# Patient Record
Sex: Female | Born: 1975 | Hispanic: Yes | Marital: Single | Smoking: Never smoker
Health system: Southern US, Community
[De-identification: ages and names within clinical notes are randomized; demographics above are authoritative.]

## PROBLEM LIST (undated history)

## (undated) DIAGNOSIS — Z789 Other specified health status: Secondary | ICD-10-CM

## (undated) HISTORY — PX: NO PAST SURGERIES: SHX2092

---

## 2005-07-30 ENCOUNTER — Emergency Department: Payer: Self-pay | Admitting: Emergency Medicine

## 2007-04-18 ENCOUNTER — Emergency Department: Payer: Self-pay | Admitting: Emergency Medicine

## 2016-03-13 ENCOUNTER — Emergency Department
Admission: EM | Admit: 2016-03-13 | Discharge: 2016-03-13 | Disposition: A | Discharge status: Home / Self Care | Payer: BLUE CROSS/BLUE SHIELD | Attending: Emergency Medicine | Admitting: Emergency Medicine

## 2016-03-13 ENCOUNTER — Emergency Department: Payer: BLUE CROSS/BLUE SHIELD

## 2016-03-13 ENCOUNTER — Encounter: Payer: Self-pay | Admitting: *Deleted

## 2016-03-13 DIAGNOSIS — N939 Abnormal uterine and vaginal bleeding, unspecified: Secondary | ICD-10-CM | POA: Diagnosis present

## 2016-03-13 DIAGNOSIS — Z3A12 12 weeks gestation of pregnancy: Secondary | ICD-10-CM | POA: Insufficient documentation

## 2016-03-13 DIAGNOSIS — O418X10 Other specified disorders of amniotic fluid and membranes, first trimester, not applicable or unspecified: Secondary | ICD-10-CM

## 2016-03-13 DIAGNOSIS — O200 Threatened abortion: Secondary | ICD-10-CM | POA: Diagnosis not present

## 2016-03-13 DIAGNOSIS — O468X1 Other antepartum hemorrhage, first trimester: Secondary | ICD-10-CM

## 2016-03-13 LAB — URINALYSIS COMPLETE WITH MICROSCOPIC (ARMC ONLY)
BILIRUBIN URINE: NEGATIVE
Bacteria, UA: NONE SEEN
Glucose, UA: NEGATIVE mg/dL
KETONES UR: NEGATIVE mg/dL
LEUKOCYTES UA: NEGATIVE
NITRITE: NEGATIVE
PH: 7 (ref 5.0–8.0)
PROTEIN: NEGATIVE mg/dL
Specific Gravity, Urine: 1.005 (ref 1.005–1.030)

## 2016-03-13 LAB — CBC
HEMATOCRIT: 38.5 % (ref 35.0–47.0)
Hemoglobin: 13.3 g/dL (ref 12.0–16.0)
MCH: 31.3 pg (ref 26.0–34.0)
MCHC: 34.6 g/dL (ref 32.0–36.0)
MCV: 90.5 fL (ref 80.0–100.0)
Platelets: 300 10*3/uL (ref 150–440)
RBC: 4.25 MIL/uL (ref 3.80–5.20)
RDW: 12.5 % (ref 11.5–14.5)
WBC: 10.4 10*3/uL (ref 3.6–11.0)

## 2016-03-13 LAB — HCG, QUANTITATIVE, PREGNANCY: HCG, BETA CHAIN, QUANT, S: 236500 m[IU]/mL — AB (ref <5)

## 2016-03-13 LAB — BASIC METABOLIC PANEL
ANION GAP: 8 (ref 5–15)
BUN: 6 mg/dL (ref 6–20)
CALCIUM: 9.4 mg/dL (ref 8.9–10.3)
CO2: 23 mmol/L (ref 22–32)
Chloride: 103 mmol/L (ref 101–111)
Creatinine, Ser: 0.5 mg/dL (ref 0.44–1.00)
GLUCOSE: 99 mg/dL (ref 65–99)
POTASSIUM: 3.8 mmol/L (ref 3.5–5.1)
Sodium: 134 mmol/L — ABNORMAL LOW (ref 135–145)

## 2016-03-13 LAB — POCT PREGNANCY, URINE: Preg Test, Ur: POSITIVE — AB

## 2016-03-13 NOTE — ED Provider Notes (Signed)
[facility] Emergency Department Provider Note  ____________________________________________  Time seen: Approximately 8:15 PM  I have reviewed the triage vital signs and the nursing notes.   HISTORY  Chief Complaint Vaginal Bleeding  The patient and/or family speak(s) Spanish.  They understand they have the right to the use of a hospital interpreter, however at this time they prefer to speak directly with me in Spanish.  They know that they can ask for an interpreter at any time.   HPI Alexandria Snyder is a 40 y.o. female with no significant past medical history who is pregnant at approximately [redacted] weeks gestation by dates.  She has not had any prenatal care yet although it is planned.  The patient reports that she had acute onset of a small amount of dark vaginal bleeding with small clots.  This started about 3 hours ago.  It is not been enough volume that she has needed to use a pad she has noticed that both when urinating and when not urinating.  She has had some lower abdominal cramps throughout her pregnancy as well as some lower back pain and this seems a little bit worse today but is still mild.  Nothing in particular makes her symptoms better or worse.  She denies fever/chills, chest pain, shortness of breath, nausea, vomiting, diarrhea, dysuria.  She is G1.   No past medical history on file.  There are no active problems to display for this patient.   No past surgical history on file.  No current outpatient prescriptions on file.  Allergies Review of patient's allergies indicates no known allergies.  No family history on file.  Social History Social History  Substance Use Topics  . Smoking status: Never Smoker   . Smokeless tobacco: Not on file  . Alcohol Use: No    Review of Systems Constitutional: No fever/chills Eyes: No visual changes. ENT: No sore throat. Cardiovascular: Denies chest pain. Respiratory: Denies shortness of  breath. Gastrointestinal: No abdominal pain.  No nausea, no vomiting.  No diarrhea.  No constipation. Genitourinary: Small amount of vaginal bleeding approximately [redacted] weeks gestation Musculoskeletal: Negative for back pain. Skin: Negative for rash. Neurological: Negative for headaches, focal weakness or numbness.  10-point ROS otherwise negative.  ____________________________________________   PHYSICAL EXAM:  VITAL SIGNS: ED Triage Vitals  Enc Vitals Group     BP 03/13/16 1750 129/74 mmHg     Pulse Rate 03/13/16 1750 74     Resp 03/13/16 1750 16     Temp 03/13/16 1750 98.8 F (37.1 C)     Temp Source 03/13/16 1750 Oral     SpO2 03/13/16 1750 98 %     Weight 03/13/16 1750 155 lb (70.308 kg)     Height 03/13/16 1750  (1.575 m)     Head Cir --      Peak Flow --      Pain Score 03/13/16 1756 5     Pain Loc --      Pain Edu? --      Excl. in GC? --     Constitutional: Alert and oriented. Well appearing and in no acute distress. Eyes: Conjunctivae are normal. PERRL. EOMI. Head: Atraumatic. Nose: No congestion/rhinnorhea. Mouth/Throat: Mucous membranes are moist.  Oropharynx non-erythematous. Neck: No stridor.  No meningeal signs.   Cardiovascular: Normal rate, regular rhythm. Good peripheral circulation. Grossly normal heart sounds.   Respiratory: Normal respiratory effort.  No retractions. Lungs CTAB. Gastrointestinal: Soft and nontender. No distention.  Genitourinary: Deferred Musculoskeletal: No lower extremity tenderness nor edema. No gross deformities of extremities. Neurologic:  Normal speech and language. No gross focal neurologic deficits are appreciated.  Skin:  Skin is warm, dry and intact. No rash noted. Psychiatric: Mood and affect are normal. Speech and behavior are normal.  ____________________________________________   LABS (all labs ordered are listed, but only abnormal results are displayed)  Labs Reviewed  HCG, QUANTITATIVE, PREGNANCY -  Abnormal; Notable for the following:    hCG, Beta Chain, [place] Longest [id number] (*)    All other components within normal limits  BASIC METABOLIC PANEL - Abnormal; Notable for the following:    Sodium 134 (*)    All other components within normal limits  URINALYSIS COMPLETEWITH MICROSCOPIC ([facility] ONLY) - Abnormal; Notable for the following:    Color, Urine STRAW (*)    APPearance CLEAR (*)    Hgb urine dipstick 3+ (*)    Squamous Epithelial / LPF 0-5 (*)    All other components within normal limits  POCT PREGNANCY, URINE - Abnormal; Notable for the following:    Preg Test, Ur POSITIVE (*)    All other components within normal limits  CBC  POC URINE PREG, ED  ABO/RH   ____________________________________________  EKG  None ____________________________________________  RADIOLOGY   US Ob Comp Less 14 Wks  03/13/2016  CLINICAL DATA:  40 year old pregnant female presenting with 1 hour history of vaginal bleeding. EXAM: OBSTETRIC <14 WK ULTRASOUND TECHNIQUE: Transabdominal ultrasound was performed for evaluation of the gestation as well as the maternal uterus and adnexal regions. COMPARISON:  No priors. FINDINGS: Intrauterine gestational sac: Present. Yolk sac:  Present. Embryo:  Present. Cardiac Activity: Present. Heart Rate: 169 bpm CRL:   3.1  mm   10 w 0 d                  [place] EDC: 10/09/2016. Subchorionic hemorrhage:  Small. Maternal uterus/adnexae: Probable degenerating corpus luteum cyst in the right ovary. Left ovary could not be visualized secondary to overlying bowel gas. No significant volume of free fluid in the cul-de-sac. IMPRESSION: 1. Single viable IUP with estimated gestational age of [redacted] weeks and 0 days, and normal fetal heart rate of 169 beats per minute. 2. Small subchorionic hemorrhage. Electronically Signed   By: Trudie Reed M.D.   On: 03/13/2016 21:06    ____________________________________________   PROCEDURES  Procedure(s) performed: None  Critical Care  performed: No ____________________________________________   INITIAL IMPRESSION / ASSESSMENT AND PLAN / ED COURSE  Pertinent labs & imaging results that were available during my care of the patient were reviewed by me and considered in my medical decision making (see chart for details).  O+, no need for RhoGAM.  U/S pending.   (Note that documentation was delayed due to multiple ED patients requiring immediate care.)   Ultrasound consistent with a small subchorionic hemorrhage and a well-appearing single IUP at 10 weeks.  I updated the patient and her companion with this information.  She is comfortable following up as an outpatient.  I gave my usual and customary return precautions  ____________________________________________  FINAL CLINICAL IMPRESSION(S) / ED DIAGNOSES  Final diagnoses:  Threatened miscarriage in early pregnancy  Subchorionic hemorrhage, first trimester      NEW MEDICATIONS STARTED DURING THIS VISIT:  There are no discharge medications for this patient.     Note:  This document was prepared using Dragon voice recognition software and may include unintentional dictation errors.   Loleta Rose,  MD 03/13/16 2338

## 2016-03-13 NOTE — ED Notes (Addendum)
Dr. York CeriseForbach at bedside.   Pt presents with vaginal bleeding that began 3 hours ago, some cramping and back pain. Pt is [redacted] weeks pregnant. Family at bedside.

## 2016-03-13 NOTE — ED Notes (Signed)
Pt reports she is pregnant approx 12 weeks.  Today pt started having vag bleeding.  Pt has abd pain and back pain  No dysuria.  Pt alert.

## 2016-03-13 NOTE — ED Notes (Signed)
Called lab about ABO/Rh. Lab is able to run test off of short lavender sent down earlier. They stated they would run test.

## 2016-03-13 NOTE — Discharge Instructions (Signed)
Hematoma subcorinico (Subchorionic Hematoma) Un hematoma subcorinico es una acumulacin de sangre entre la pared externa de la placenta y la pared interna del la matriz (tero). La placenta es el rgano que conecta el feto a la pared del tero. La placenta realiza la funcin de alimentacin, respiracin (oxgeno al feto) y el trabajo de eliminacin de desechos (excrecin) del feto.  Un hematoma subcorinico es la anormalidad ms frecuente encontrada en una ecografa durante el primer trimestre o principios del segundo trimestre del embarazo. Si ha habido poca o ninguna hemorragia vaginal, generalmente los pequeos hematomas se reducen por su propia cuenta y no afectan al beb ni al Vanetta Muldersembarazo. La sangre es absorbida gradualmente durante una o Scotiados semanas. Cuando la hemorragia comienza ms tarde en el embarazo o el hematoma es ms grande o se produce en una paciente de edad avanzada, el resultado puede no ser tan bueno. Los grandes hematomas pueden agrandarse an ms y Lesothoaumenta las posibilidades de aborto espontneo. El hematoma subcorinico tambin aumenta el riesgo de desprendimiento precoz de la placenta del tero, muerte fetal y Sport and exercise psychologistparto prematuro. INSTRUCCIONES PARA EL CUIDADO EN EL HOGAR   Repose en cama si el mdico se lo recomienda. Aunque el reposo en cama no evitar la hemorragia o un aborto espontneo, su mdico puede recomendarlo.  Evite levantar objetos pesados (ms de 10 libras [4,5 kg]), hacer ejercicio, tener relaciones sexuales o realizar duchas vaginales segn se lo indique el profesional.  Lleve un registro de la cantidad y Hydrographic surveyorgrado de remojo (saturacin) de las toallas higinicas que Landscape architectutiliza cada da. Anote esta informacin.  No use tampones.  Cumpla con todas las visitas de control, segn le indique su mdico. El profesional podr pedirle que se realice anlisis de seguimiento, pruebas de Peninsulaultrasonido o Lititzambas. SOLICITE ATENCIN MDICA DE INMEDIATO SI:   Siente calambres intensos en el  estmago, en la espalda, en el abdomen o en la pelvis.  Tiene fiebre.  Elimina cogulos o tejidos grandes. Guarde los tejidos para que su mdico los vea.  Si la hemorragia aumenta o siente mareos, debilidad o tiene episodios de Russellvilledesmayos.   Esta informacin no tiene Theme park managercomo fin reemplazar el consejo del mdico. Asegrese de hacerle al mdico cualquier pregunta que tenga.   Document Released: 02/27/2009 Document Revised: 09/01/2013 Elsevier Interactive Patient Education 2016 [employer]  Amenaza de aborto (Threatened Miscarriage) La amenaza de aborto se produce cuando hay hemorragia vaginal durante las primeras 20semanas de Hudsonembarazo, pero el embarazo no se interrumpe. Si durante este perodo usted tiene hemorragia vaginal, el mdico le har pruebas para asegurarse de que el embarazo contine. Si las pruebas muestran que usted contina embarazada y que el "beb" en desarrollo (feto) dentro del tero sigue creciendo, se considera que tuvo una South Londonderryamenaza de aborto. La amenaza de aborto no implica que el embarazo vaya a Midwifeterminar, pero s aumenta el riesgo de perder el embarazo (aborto completo). CAUSAS  Por lo general, no se conoce la causa de la amenaza de aborto. Si el resultado final es el aborto completo, la causa ms frecuente es la cantidad anormal de cromosomas del feto. Los cromosomas son las estructuras internas de las clulas que contienen todo Animatorel material gentico. Environmental managerAlgunas de las causas de hemorragia vaginal que no ocasionan un aborto incluyen:  Las Clinical research associaterelaciones sexuales.  Las infecciones.  Los cambios hormonales normales durante el Ryderembarazo.  La hemorragia que se produce cuando el vulo se implanta en el tero. FACTORES DE RIESGO Los factores de riesgo de hemorragia al principio del  embarazo incluyen:  Obesidad.  Fumar.  El consumo de cantidades excesivas de alcohol o cafena.  El consumo de drogas. SIGNOS Y SNTOMAS  Hemorragia vaginal leve.  Dolor o clicos abdominales  leves. DIAGNSTICO  Si tiene hemorragia con o sin dolor abdominal antes de las 20semanas de Hiram, el mdico le har pruebas para determinar si el embarazo contina. Una prueba importante incluye el uso de ondas sonoras y de una computadora (ecografa) para crear imgenes del interior del tero. Otras pruebas incluyen el examen interno de la vagina y el tero (examen plvico), y el control de la frecuencia cardaca del feto.  Es posible que le diagnostiquen una amenaza de aborto en los siguientes casos:  La ecografa muestra que el embarazo contina.  La frecuencia cardaca del feto es alta.  El examen plvico muestra que la apertura entre el tero y la vagina (cuello del tero) est cerrada.  Su frecuencia cardaca y su presin arterial estn estables.  Los [employer] Corporation de sangre confirman que el embarazo contina. TRATAMIENTO  No se ha demostrado que ningn tratamiento evite que una amenaza de aborto se [place] en un aborto completo. Sin embargo, los cuidados [employer] hogar son importantes.  INSTRUCCIONES PARA EL CUIDADO EN EL HOGAR   Asegrese de asistir a todas las citas de cuidados prenatales. Esto es [employer].  Descanse lo suficiente.  No tenga relaciones sexuales ni use tampones si tiene hemorragia vaginal.  No se haga duchas vaginales.  No fume ni consuma drogas.  No beba alcohol.  Evite la cafena. SOLICITE ATENCIN MDICA SI:  Tiene una ligera hemorragia o manchado vaginal durante el embarazo.  Tiene dolor o clicos en el abdomen.  Tiene fiebre. SOLICITE ATENCIN MDICA DE INMEDIATO SI:  Tiene una hemorragia vaginal abundante.  Elimina cogulos de sangre por la vagina.  Siente dolor en la parte baja de la espalda o clicos abdominales intensos.  Tiene fiebre, escalofros y dolor abdominal intenso. ASEGRESE DE QUE:  Comprende estas instrucciones.  Controlar su afeccin.  Recibir ayuda de inmediato si no mejora o si empeora.   Esta  informacin no tiene Theme park manager el consejo del mdico. Asegrese de hacerle al mdico cualquier pregunta que tenga.   Document Released: 08/21/2005 Document Revised: 11/16/2013 Elsevier Interactive Patient Education 2016 [employer].  Hemorragia vaginal durante Firefighter (primer trimestre) (Vaginal Bleeding During Pregnancy, First Trimester) Durante los primeros meses del embarazo es relativamente frecuente que se presente una pequea hemorragia (manchas). Esta situacin generalmente mejora por s misma. Estas hemorragias o manchas tienen diversas causas al inicio del embarazo. Algunas manchas pueden estar relacionadas al [employer] y otras no. En la [employer], la hemorragia es normal y no es un problema. Sin embargo, la hemorragia tambin puede ser un signo de algo grave. Debe informar a su mdico de inmediato si tiene alguna hemorragia vaginal. Algunas causas posibles de hemorragia vaginal durante el primer trimestre incluyen:  Infeccin o inflamacin del cuello del tero.  Crecimientos anormales (plipos) en el cuello del tero.  Aborto espontneo o amenaza de aborto espontneo.  Tejido del Psychiatrist se ha desarrollado fuera del tero y en una trompa de falopio (embarazo ectpico).  Se han desarrollado pequeos quistes en el tero en lugar de tejido de embarazo (embarazo molar). INSTRUCCIONES PARA EL CUIDADO EN EL HOGAR  Controle su afeccin para ver si hay cambios. Las siguientes indicaciones ayudarn a Psychologist, educational [employer] pueda sentir:  Siga las indicaciones del mdico para restringir su actividad. Si  el mdico le indica descanso en la cama, debe quedarse en la cama y levantarse solo para ir al bao. No obstante, el mdico puede permitirle que continu con tareas livianas.  Si es necesario, organcese para que alguien le ayude con las actividades y responsabilidades cotidianas mientras est en cama.  Lleve un registro de la cantidad y la saturacin de  las toallas higinicas que Landscape architect. Anote este dato.  No use tampones.No se haga duchas vaginales.  No tenga relaciones sexuales u orgasmos hasta que el mdico la autorice.  Si elimina tejido por la vagina, gurdelo para mostrrselo al [employer].  [place] solo medicamentos de venta libre o recetados, segn las indicaciones del mdico.  No tome aspirina, ya que puede causar hemorragias.  Cumpla con todas las visitas de control, segn le indique su mdico. SOLICITE ATENCIN MDICA SI:  Tiene un sangrado vaginal en cualquier momento del embarazo.  Tiene calambres o dolores de [place].  Tiene fiebre que los medicamentos no Sports coach. SOLICITE ATENCIN MDICA DE INMEDIATO SI:   Siente calambres intensos en la espalda o en el vientre (abdomen).  Elimina cogulos grandes o tejido por la vagina.  La hemorragia aumenta.  Si se siente mareada, dbil o se desmaya.  Tiene escalofros.  Tiene una prdida importante o sale lquido a borbotones por la vagina.  Se desmaya mientras defeca. ASEGRESE DE QUE:  Comprende estas instrucciones.  Controlar su afeccin.  Recibir ayuda de inmediato si no mejora o si empeora.   Esta informacin no tiene Theme park manager el consejo del mdico. Asegrese de hacerle al mdico cualquier pregunta que tenga.   Document Released: 08/21/2005 Document Revised: 11/16/2013 Elsevier Interactive Patient Education [employer]

## 2016-03-14 LAB — ABO/RH: ABO/RH(D): O POS

## 2016-05-01 ENCOUNTER — Other Ambulatory Visit: Payer: Self-pay | Admitting: Family Medicine

## 2016-05-01 DIAGNOSIS — Z3492 Encounter for supervision of normal pregnancy, unspecified, second trimester: Secondary | ICD-10-CM

## 2016-05-01 LAB — OB RESULTS CONSOLE HEPATITIS B SURFACE ANTIGEN: HEP B S AG: NEGATIVE

## 2016-05-01 LAB — OB RESULTS CONSOLE HIV ANTIBODY (ROUTINE TESTING): HIV: NONREACTIVE

## 2016-05-01 LAB — OB RESULTS CONSOLE GC/CHLAMYDIA
Chlamydia: POSITIVE
Gonorrhea: NEGATIVE

## 2016-05-01 LAB — OB RESULTS CONSOLE RUBELLA ANTIBODY, IGM: RUBELLA: IMMUNE

## 2016-05-01 LAB — OB RESULTS CONSOLE RPR: RPR: NONREACTIVE

## 2016-05-01 LAB — OB RESULTS CONSOLE VARICELLA ZOSTER ANTIBODY, IGG: VARICELLA IGG: IMMUNE

## 2016-05-15 ENCOUNTER — Ambulatory Visit
Admission: RE | Admit: 2016-05-15 | Discharge: 2016-05-15 | Disposition: A | Discharge status: Home / Self Care | Payer: BLUE CROSS/BLUE SHIELD | Source: Ambulatory Visit | Attending: Family Medicine | Admitting: Family Medicine

## 2016-05-15 DIAGNOSIS — Z3492 Encounter for supervision of normal pregnancy, unspecified, second trimester: Secondary | ICD-10-CM | POA: Diagnosis not present

## 2016-05-15 DIAGNOSIS — Z3A19 19 weeks gestation of pregnancy: Secondary | ICD-10-CM | POA: Diagnosis not present

## 2016-08-05 ENCOUNTER — Ambulatory Visit
Admission: RE | Admit: 2016-08-05 | Discharge: 2016-08-05 | Disposition: A | Discharge status: Home / Self Care | Payer: Self-pay | Source: Ambulatory Visit | Attending: Family Medicine | Admitting: Family Medicine

## 2016-08-05 ENCOUNTER — Other Ambulatory Visit (HOSPITAL_COMMUNITY): Payer: Self-pay | Admitting: Family Medicine

## 2016-08-05 DIAGNOSIS — R7611 Nonspecific reaction to tuberculin skin test without active tuberculosis: Secondary | ICD-10-CM

## 2016-09-03 IMAGING — US US OB COMP LESS 14 WK
1 series · 14 of 28 positions shown · non-contrast
Comparison: No priors.

CLINICAL DATA: 40-year-old pregnant female presenting with 1 hour
history of vaginal bleeding.

EXAM:
OBSTETRIC <14 WK ULTRASOUND
TECHNIQUE: Transabdominal ultrasound was performed for evaluation of the
gestation as well as the maternal uterus and adnexal regions.

[Series 1: us ob comp less 14 wk · 0.23mm/px · 14 of 78 slices shown]
[im 3/78]
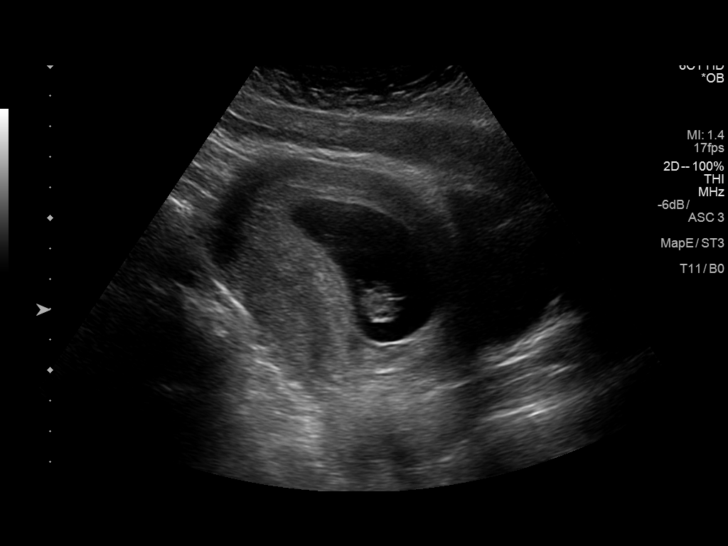
[im 9/78]
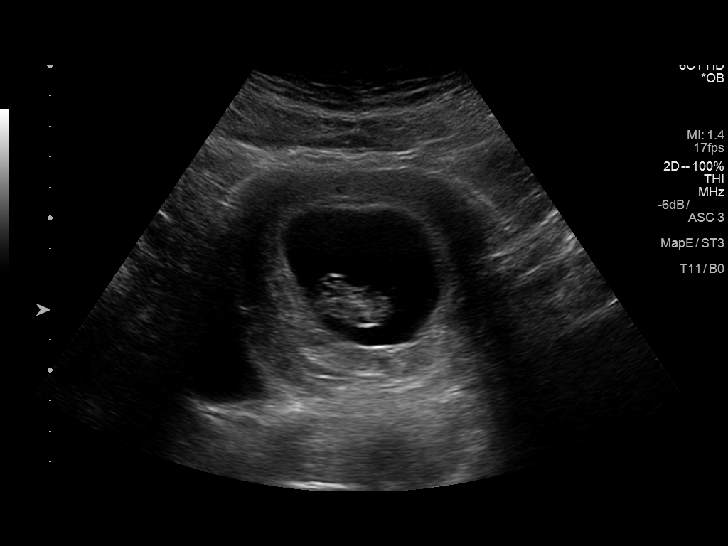
[im 15/78]
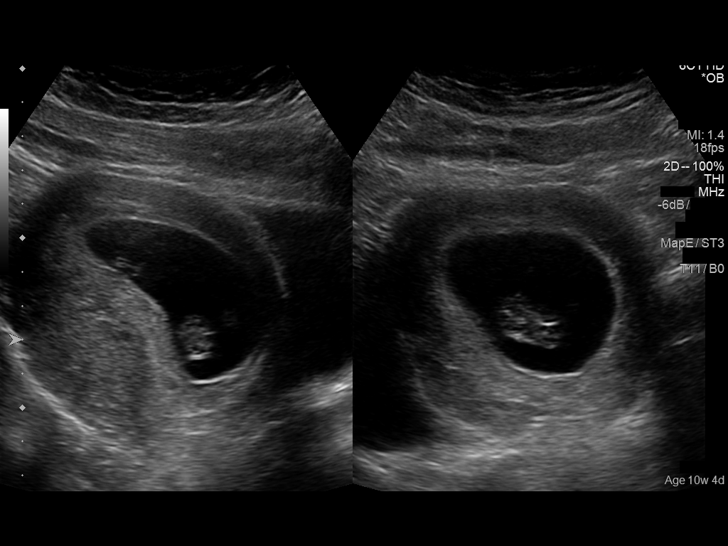
[im 20/78]
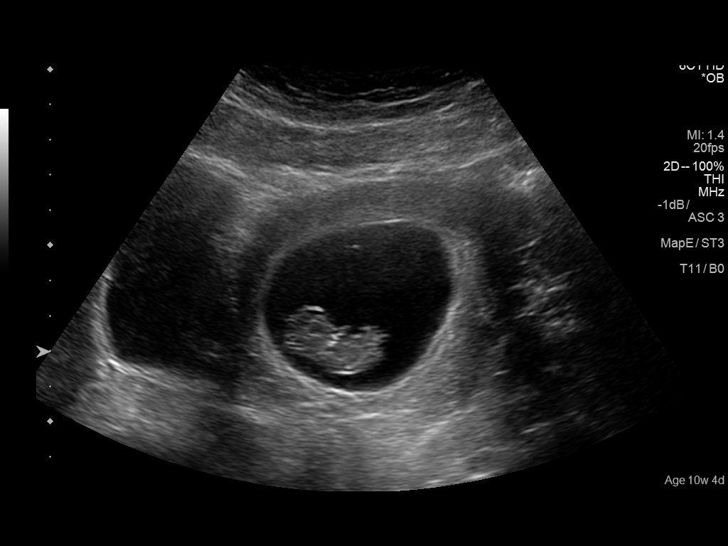
[im 26/78]
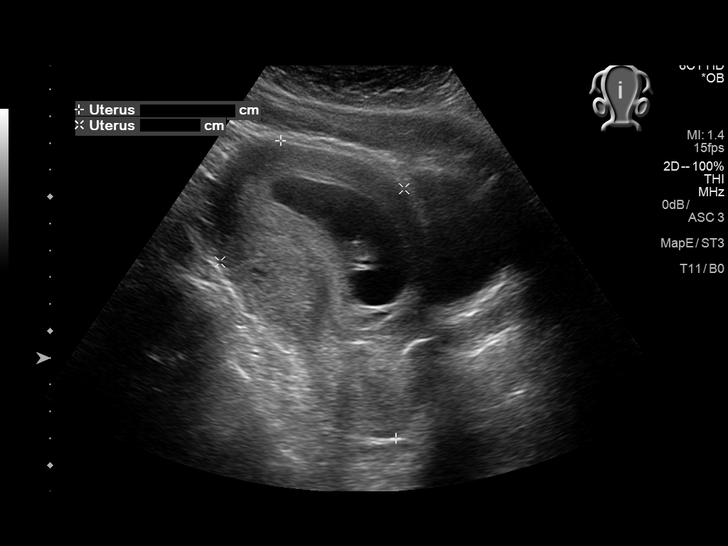
[im 32/78]
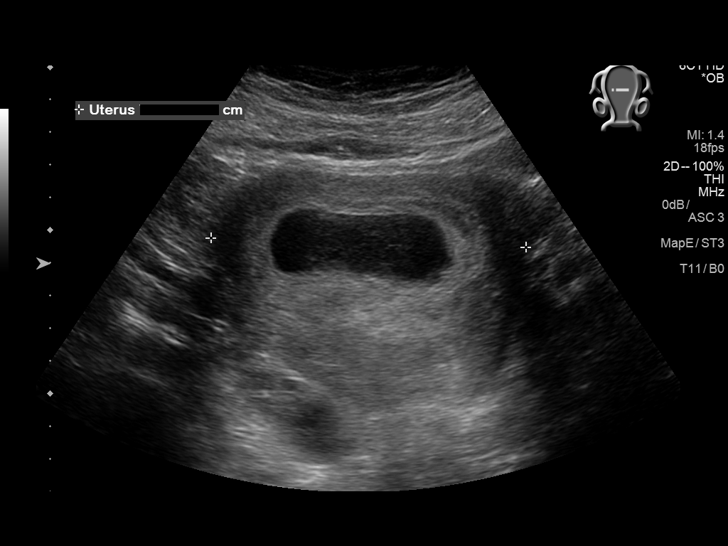
[im 38/78]
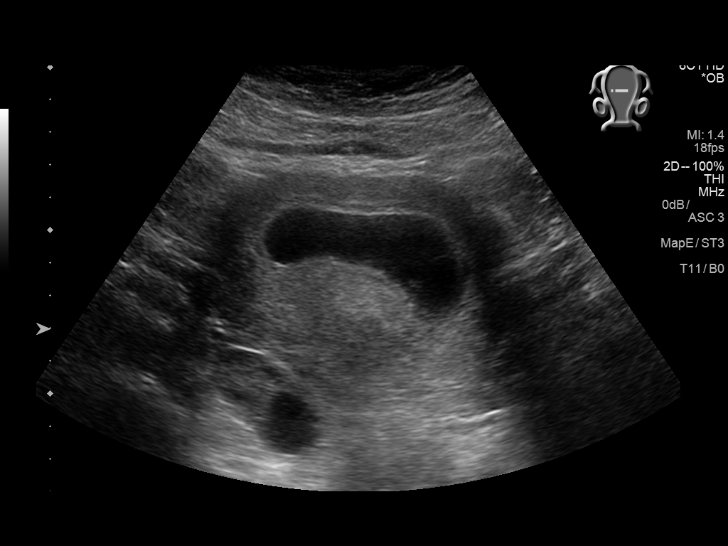
[im 43/78]
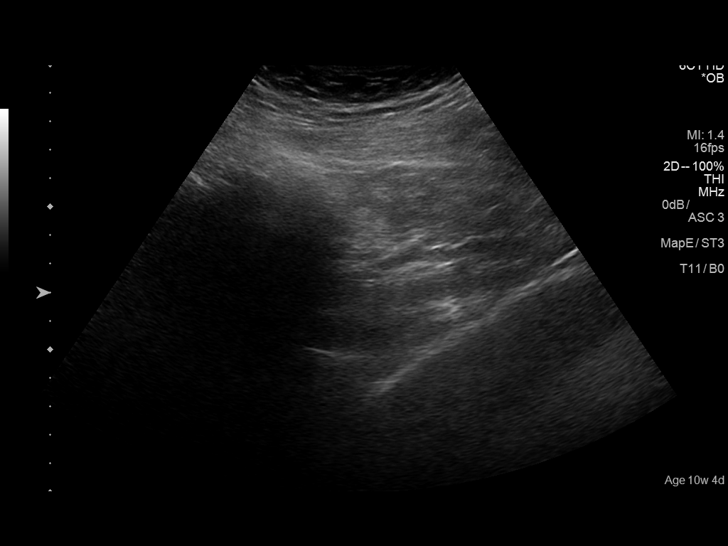
[im 49/78]
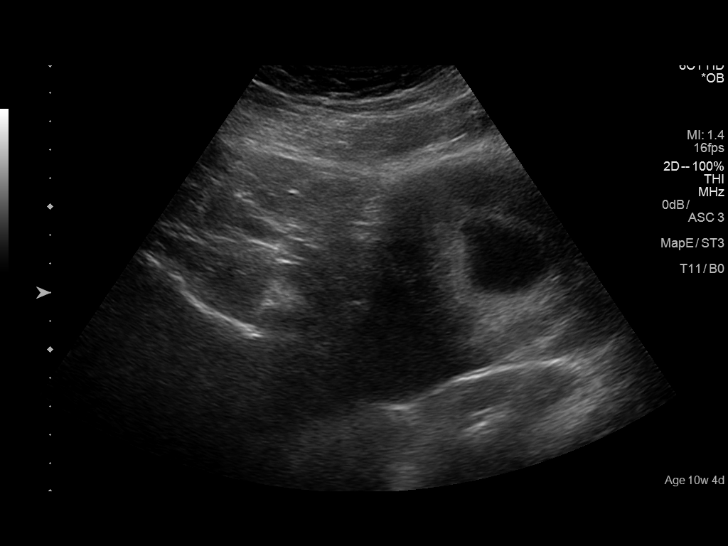
[im 55/78]
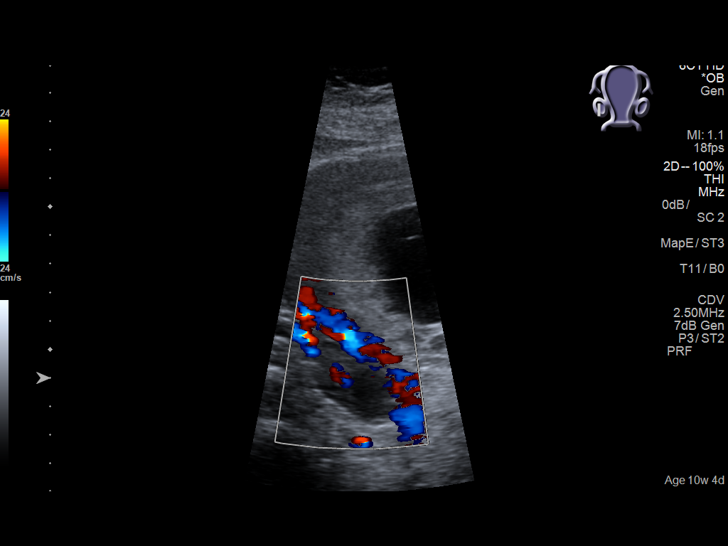
[im 60/78]
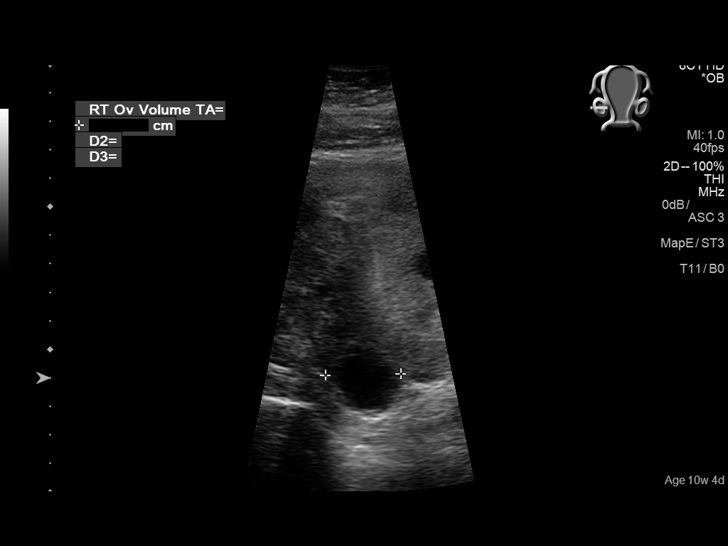
[im 66/78]
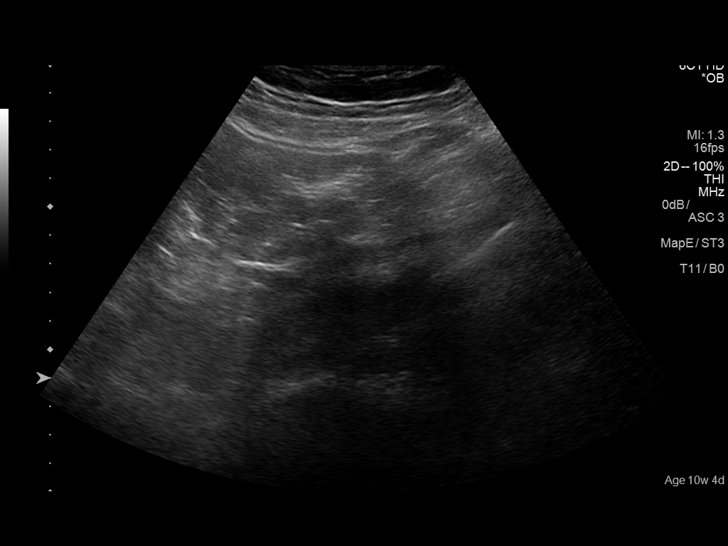
[im 72/78]
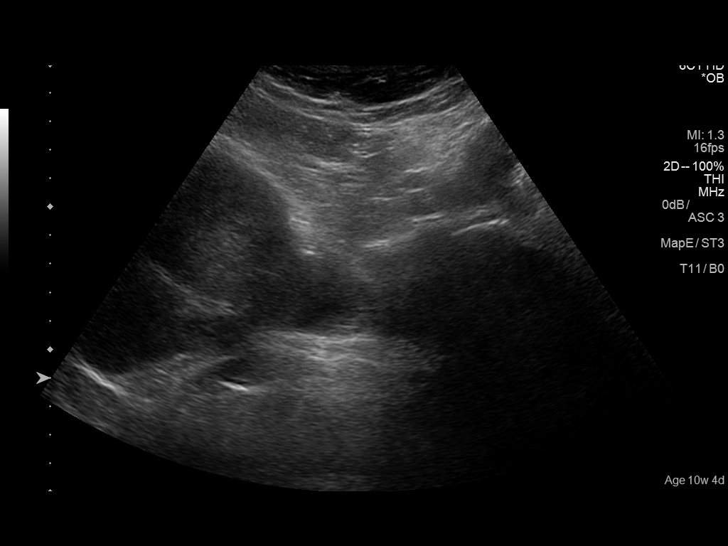
[im 78/78]
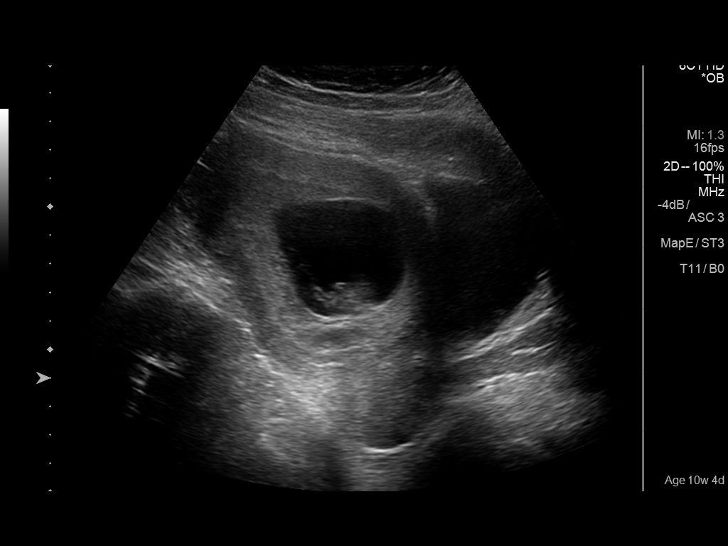

[14 of 28 positions shown; findings below may reference images not displayed]

FINDINGS: Intrauterine gestational sac: Present.

Yolk sac:  Present.

Embryo:  Present.

Cardiac Activity: Present.

Heart Rate: 169 bpm

CRL:   3.1  mm   10 w 0 d                  US EDC: 10/09/2016.

Subchorionic hemorrhage:  Small.

Maternal uterus/adnexae: Probable degenerating corpus luteum cyst in
the right ovary. Left ovary could not be visualized secondary to
overlying bowel gas. No significant volume of free fluid in the
cul-de-sac.
IMPRESSION: 1. Single viable IUP with estimated gestational age of 10 weeks and
0 days, and normal fetal heart rate of 169 beats per minute.
2. Small subchorionic hemorrhage.

## 2016-09-12 LAB — OB RESULTS CONSOLE GBS: GBS: NEGATIVE

## 2016-10-11 ENCOUNTER — Inpatient Hospital Stay: Payer: BLUE CROSS/BLUE SHIELD | Admitting: Anesthesiology

## 2016-10-11 ENCOUNTER — Inpatient Hospital Stay
Admission: EM | Admit: 2016-10-11 | Discharge: 2016-10-14 | DRG: 766 | Disposition: A | Discharge status: Home / Self Care | Payer: BLUE CROSS/BLUE SHIELD | Attending: Obstetrics and Gynecology | Admitting: Obstetrics and Gynecology

## 2016-10-11 ENCOUNTER — Encounter
Admission: EM | Disposition: A | Discharge status: Home / Self Care | Payer: Self-pay | Source: Home / Self Care | Attending: Obstetrics and Gynecology

## 2016-10-11 DIAGNOSIS — O9902 Anemia complicating childbirth: Secondary | ICD-10-CM | POA: Diagnosis present

## 2016-10-11 DIAGNOSIS — Z9889 Other specified postprocedural states: Secondary | ICD-10-CM

## 2016-10-11 DIAGNOSIS — Z3A40 40 weeks gestation of pregnancy: Secondary | ICD-10-CM

## 2016-10-11 DIAGNOSIS — O4292 Full-term premature rupture of membranes, unspecified as to length of time between rupture and onset of labor: Secondary | ICD-10-CM | POA: Diagnosis present

## 2016-10-11 DIAGNOSIS — D649 Anemia, unspecified: Secondary | ICD-10-CM | POA: Diagnosis present

## 2016-10-11 DIAGNOSIS — Z3490 Encounter for supervision of normal pregnancy, unspecified, unspecified trimester: Secondary | ICD-10-CM

## 2016-10-11 HISTORY — DX: Other specified health status: Z78.9

## 2016-10-11 LAB — COMPREHENSIVE METABOLIC PANEL
ALT: 14 U/L (ref 14–54)
ANION GAP: 9 (ref 5–15)
AST: 25 U/L (ref 15–41)
Albumin: 2.7 g/dL — ABNORMAL LOW (ref 3.5–5.0)
Alkaline Phosphatase: 306 U/L — ABNORMAL HIGH (ref 38–126)
BUN: 11 mg/dL (ref 6–20)
CHLORIDE: 110 mmol/L (ref 101–111)
CO2: 17 mmol/L — AB (ref 22–32)
Calcium: 8.3 mg/dL — ABNORMAL LOW (ref 8.9–10.3)
Creatinine, Ser: 0.6 mg/dL (ref 0.44–1.00)
Glucose, Bld: 101 mg/dL — ABNORMAL HIGH (ref 65–99)
POTASSIUM: 3.8 mmol/L (ref 3.5–5.1)
SODIUM: 136 mmol/L (ref 135–145)
Total Bilirubin: 0.5 mg/dL (ref 0.3–1.2)
Total Protein: 6.7 g/dL (ref 6.5–8.1)

## 2016-10-11 LAB — PROTEIN / CREATININE RATIO, URINE
CREATININE, URINE: 96 mg/dL
PROTEIN CREATININE RATIO: 0.86 mg/mg{creat} — AB (ref 0.00–0.15)
TOTAL PROTEIN, URINE: 83 mg/dL

## 2016-10-11 LAB — CBC
HCT: 34.8 % — ABNORMAL LOW (ref 35.0–47.0)
Hemoglobin: 12 g/dL (ref 12.0–16.0)
MCH: 31.7 pg (ref 26.0–34.0)
MCHC: 34.4 g/dL (ref 32.0–36.0)
MCV: 92 fL (ref 80.0–100.0)
PLATELETS: 285 10*3/uL (ref 150–440)
RBC: 3.78 MIL/uL — AB (ref 3.80–5.20)
RDW: 13.6 % (ref 11.5–14.5)
WBC: 8.9 10*3/uL (ref 3.6–11.0)

## 2016-10-11 LAB — CHLAMYDIA/NGC RT PCR (ARMC ONLY)
CHLAMYDIA TR: NOT DETECTED
N GONORRHOEAE: NOT DETECTED

## 2016-10-11 LAB — TYPE AND SCREEN
ABO/RH(D): O POS
ANTIBODY SCREEN: NEGATIVE

## 2016-10-11 SURGERY — Surgical Case
Anesthesia: Spinal | Site: Abdomen | Wound class: Clean Contaminated

## 2016-10-11 MED ORDER — OXYTOCIN 40 UNITS IN LACTATED RINGERS INFUSION - SIMPLE MED
1.0000 m[IU]/min | INTRAVENOUS | Status: DC
  Administered 2016-10-11: 1 m[IU]/min via INTRAVENOUS

## 2016-10-11 MED ORDER — ONDANSETRON HCL 4 MG/2ML IJ SOLN: 4.0000 mg | Freq: Four times a day (QID) | INTRAMUSCULAR | Status: DC | PRN

## 2016-10-11 MED ORDER — SENNOSIDES-DOCUSATE SODIUM 8.6-50 MG PO TABS
2.0000 | ORAL_TABLET | ORAL | Status: DC
  Administered 2016-10-13 (×2): 2 via ORAL
  Filled 2016-10-11 (×2): qty 2

## 2016-10-11 MED ORDER — OXYTOCIN BOLUS FROM INFUSION: 500.0000 mL | Freq: Once | INTRAVENOUS | Status: DC

## 2016-10-11 MED ORDER — ACETAMINOPHEN 325 MG PO TABS: 650.0000 mg | ORAL_TABLET | ORAL | Status: DC | PRN

## 2016-10-11 MED ORDER — SIMETHICONE 80 MG PO CHEW
80.0000 mg | CHEWABLE_TABLET | ORAL | Status: DC
  Administered 2016-10-12 – 2016-10-13 (×3): 80 mg via ORAL
  Filled 2016-10-11 (×3): qty 1

## 2016-10-11 MED ORDER — LACTATED RINGERS IV SOLN
INTRAVENOUS | Status: DC
  Administered 2016-10-11 (×3): via INTRAVENOUS

## 2016-10-11 MED ORDER — PHENYLEPHRINE 40 MCG/ML (10ML) SYRINGE FOR IV PUSH (FOR BLOOD PRESSURE SUPPORT)
PREFILLED_SYRINGE | INTRAVENOUS | Status: DC | PRN
  Administered 2016-10-11 (×2): 100 ug via INTRAVENOUS

## 2016-10-11 MED ORDER — OXYCODONE HCL 5 MG PO TABS: 5.0000 mg | ORAL_TABLET | ORAL | Status: DC | PRN

## 2016-10-11 MED ORDER — DIPHENHYDRAMINE HCL 25 MG PO CAPS: 25.0000 mg | ORAL_CAPSULE | ORAL | Status: DC | PRN

## 2016-10-11 MED ORDER — KETOROLAC TROMETHAMINE 30 MG/ML IJ SOLN: 30.0000 mg | Freq: Four times a day (QID) | INTRAMUSCULAR | Status: AC

## 2016-10-11 MED ORDER — PHENYLEPHRINE HCL 10 MG/ML IJ SOLN
INTRAMUSCULAR | Status: DC | PRN
  Administered 2016-10-11: 100 ug via INTRAVENOUS

## 2016-10-11 MED ORDER — LIDOCAINE HCL (PF) 1 % IJ SOLN: 30.0000 mL | INTRAMUSCULAR | Status: DC | PRN

## 2016-10-11 MED ORDER — KETOROLAC TROMETHAMINE 30 MG/ML IJ SOLN
30.0000 mg | Freq: Four times a day (QID) | INTRAMUSCULAR | Status: AC
  Administered 2016-10-11 – 2016-10-12 (×4): 30 mg via INTRAVENOUS
  Filled 2016-10-11 (×4): qty 1

## 2016-10-11 MED ORDER — TETANUS-DIPHTH-ACELL PERTUSSIS 5-2.5-18.5 LF-MCG/0.5 IM SUSP: 0.5000 mL | Freq: Once | INTRAMUSCULAR | Status: DC

## 2016-10-11 MED ORDER — CEFAZOLIN SODIUM-DEXTROSE 2-4 GM/100ML-% IV SOLN
2.0000 g | Freq: Once | INTRAVENOUS | Status: AC
  Administered 2016-10-11: 2 g via INTRAVENOUS

## 2016-10-11 MED ORDER — SIMETHICONE 80 MG PO CHEW
80.0000 mg | CHEWABLE_TABLET | Freq: Three times a day (TID) | ORAL | Status: DC
  Administered 2016-10-12 – 2016-10-14 (×7): 80 mg via ORAL
  Filled 2016-10-11 (×7): qty 1

## 2016-10-11 MED ORDER — SIMETHICONE 80 MG PO CHEW: 80.0000 mg | CHEWABLE_TABLET | ORAL | Status: DC | PRN

## 2016-10-11 MED ORDER — MISOPROSTOL 200 MCG PO TABS
ORAL_TABLET | ORAL | Status: AC
  Filled 2016-10-11: qty 4

## 2016-10-11 MED ORDER — METHYLERGONOVINE MALEATE 0.2 MG/ML IJ SOLN
INTRAMUSCULAR | Status: DC | PRN
  Administered 2016-10-11: 0.2 mg via INTRAMUSCULAR

## 2016-10-11 MED ORDER — SOD CITRATE-CITRIC ACID 500-334 MG/5ML PO SOLN
ORAL | Status: AC
  Administered 2016-10-11: 30 mL via ORAL
  Filled 2016-10-11: qty 15

## 2016-10-11 MED ORDER — OXYCODONE HCL 5 MG PO TABS: 10.0000 mg | ORAL_TABLET | ORAL | Status: DC | PRN

## 2016-10-11 MED ORDER — WITCH HAZEL-GLYCERIN EX PADS: 1.0000 "application " | MEDICATED_PAD | CUTANEOUS | Status: DC | PRN

## 2016-10-11 MED ORDER — AZITHROMYCIN 500 MG IV SOLR
500.0000 mg | Freq: Once | INTRAVENOUS | Status: AC
  Administered 2016-10-11: 500 mg via INTRAVENOUS
  Filled 2016-10-11 (×2): qty 500

## 2016-10-11 MED ORDER — SOD CITRATE-CITRIC ACID 500-334 MG/5ML PO SOLN
30.0000 mL | ORAL | Status: DC | PRN
  Administered 2016-10-11: 30 mL via ORAL

## 2016-10-11 MED ORDER — DIBUCAINE 1 % RE OINT: 1.0000 "application " | TOPICAL_OINTMENT | RECTAL | Status: DC | PRN

## 2016-10-11 MED ORDER — OXYTOCIN 40 UNITS IN LACTATED RINGERS INFUSION - SIMPLE MED
2.5000 [IU]/h | INTRAVENOUS | Status: DC
  Administered 2016-10-11: 1 mL via INTRAVENOUS
  Filled 2016-10-11: qty 1000

## 2016-10-11 MED ORDER — LACTATED RINGERS IV SOLN: 500.0000 mL | INTRAVENOUS | Status: DC | PRN

## 2016-10-11 MED ORDER — BUPIVACAINE IN DEXTROSE 0.75-8.25 % IT SOLN
INTRATHECAL | Status: DC | PRN
  Administered 2016-10-11: 1.5 mL via INTRATHECAL

## 2016-10-11 MED ORDER — DIPHENHYDRAMINE HCL 50 MG/ML IJ SOLN: 12.5000 mg | INTRAMUSCULAR | Status: DC | PRN

## 2016-10-11 MED ORDER — MORPHINE SULFATE (PF) 0.5 MG/ML IJ SOLN
INTRAMUSCULAR | Status: DC | PRN
  Administered 2016-10-11: .1 mg via EPIDURAL

## 2016-10-11 MED ORDER — SCOPOLAMINE 1 MG/3DAYS TD PT72: 1.0000 | MEDICATED_PATCH | Freq: Once | TRANSDERMAL | Status: DC

## 2016-10-11 MED ORDER — NALBUPHINE HCL 10 MG/ML IJ SOLN: 5.0000 mg | INTRAMUSCULAR | Status: DC | PRN

## 2016-10-11 MED ORDER — SODIUM CHLORIDE 0.9% FLUSH: 3.0000 mL | INTRAVENOUS | Status: DC | PRN

## 2016-10-11 MED ORDER — NALOXONE HCL 0.4 MG/ML IJ SOLN: 0.4000 mg | INTRAMUSCULAR | Status: DC | PRN

## 2016-10-11 MED ORDER — DIPHENHYDRAMINE HCL 25 MG PO CAPS: 25.0000 mg | ORAL_CAPSULE | Freq: Four times a day (QID) | ORAL | Status: DC | PRN

## 2016-10-11 MED ORDER — ZOLPIDEM TARTRATE 5 MG PO TABS: 5.0000 mg | ORAL_TABLET | Freq: Every evening | ORAL | Status: DC | PRN

## 2016-10-11 MED ORDER — LIDOCAINE HCL (PF) 1 % IJ SOLN
INTRAMUSCULAR | Status: AC
  Filled 2016-10-11: qty 30

## 2016-10-11 MED ORDER — MENTHOL 3 MG MT LOZG
1.0000 | LOZENGE | OROMUCOSAL | Status: DC | PRN
  Filled 2016-10-11: qty 9

## 2016-10-11 MED ORDER — MEPERIDINE HCL 25 MG/ML IJ SOLN: 6.2500 mg | INTRAMUSCULAR | Status: DC | PRN

## 2016-10-11 MED ORDER — LACTATED RINGERS IV SOLN
INTRAVENOUS | Status: DC
  Administered 2016-10-12 (×2): via INTRAVENOUS

## 2016-10-11 MED ORDER — OXYTOCIN 40 UNITS IN LACTATED RINGERS INFUSION - SIMPLE MED
2.5000 [IU]/h | INTRAVENOUS | Status: AC
  Filled 2016-10-11: qty 1000

## 2016-10-11 MED ORDER — FENTANYL CITRATE (PF) 100 MCG/2ML IJ SOLN
INTRAMUSCULAR | Status: DC | PRN
  Administered 2016-10-11: 15 ug via INTRAVENOUS

## 2016-10-11 MED ORDER — NALBUPHINE HCL 10 MG/ML IJ SOLN: 5.0000 mg | Freq: Once | INTRAMUSCULAR | Status: DC | PRN

## 2016-10-11 MED ORDER — ACETAMINOPHEN 325 MG PO TABS
650.0000 mg | ORAL_TABLET | Freq: Four times a day (QID) | ORAL | Status: AC
Start: 2016-10-11 — End: 2016-10-12
  Administered 2016-10-11 – 2016-10-12 (×4): 650 mg via ORAL
  Filled 2016-10-11 (×4): qty 2

## 2016-10-11 MED ORDER — NALOXONE HCL 2 MG/2ML IJ SOSY
1.0000 ug/kg/h | PREFILLED_SYRINGE | INTRAVENOUS | Status: DC | PRN
  Filled 2016-10-11: qty 2

## 2016-10-11 MED ORDER — METHYLERGONOVINE MALEATE 0.2 MG/ML IJ SOLN
INTRAMUSCULAR | Status: AC
  Filled 2016-10-11: qty 1

## 2016-10-11 MED ORDER — AMMONIA AROMATIC IN INHA
RESPIRATORY_TRACT | Status: AC
  Filled 2016-10-11: qty 10

## 2016-10-11 MED ORDER — CEFAZOLIN SODIUM-DEXTROSE 2-4 GM/100ML-% IV SOLN
INTRAVENOUS | Status: AC
  Administered 2016-10-11: 2 g via INTRAVENOUS
  Filled 2016-10-11: qty 100

## 2016-10-11 MED ORDER — COCONUT OIL OIL: 1.0000 "application " | TOPICAL_OIL | Status: DC | PRN

## 2016-10-11 MED ORDER — ACETAMINOPHEN 325 MG PO TABS
650.0000 mg | ORAL_TABLET | ORAL | Status: DC | PRN
  Administered 2016-10-12: 650 mg via ORAL
  Filled 2016-10-11 (×2): qty 2

## 2016-10-11 MED ORDER — TERBUTALINE SULFATE 1 MG/ML IJ SOLN: 0.2500 mg | Freq: Once | INTRAMUSCULAR | Status: DC | PRN

## 2016-10-11 MED ORDER — BUTORPHANOL TARTRATE 1 MG/ML IJ SOLN: 1.0000 mg | INTRAMUSCULAR | Status: DC | PRN

## 2016-10-11 MED ORDER — IBUPROFEN 600 MG PO TABS
600.0000 mg | ORAL_TABLET | Freq: Four times a day (QID) | ORAL | Status: DC
  Administered 2016-10-12 – 2016-10-14 (×7): 600 mg via ORAL
  Filled 2016-10-11 (×8): qty 1

## 2016-10-11 MED ORDER — PRENATAL MULTIVITAMIN CH
1.0000 | ORAL_TABLET | Freq: Every day | ORAL | Status: DC
  Administered 2016-10-12 – 2016-10-14 (×3): 1 via ORAL
  Filled 2016-10-11 (×3): qty 1

## 2016-10-11 MED ORDER — OXYTOCIN 10 UNIT/ML IJ SOLN
INTRAMUSCULAR | Status: AC
  Filled 2016-10-11: qty 2

## 2016-10-11 MED ORDER — ONDANSETRON HCL 4 MG/2ML IJ SOLN
4.0000 mg | Freq: Three times a day (TID) | INTRAMUSCULAR | Status: DC | PRN
  Administered 2016-10-11: 4 mg via INTRAVENOUS

## 2016-10-11 SURGICAL SUPPLY — 24 items
BARRIER ADHS 3X4 INTERCEED (GAUZE/BANDAGES/DRESSINGS) ×3 IMPLANT
CANISTER SUCT 3000ML (MISCELLANEOUS) ×3 IMPLANT
CATH KIT ON-Q SILVERSOAK 5IN (CATHETERS) ×6 IMPLANT
CHLORAPREP W/TINT 26ML (MISCELLANEOUS) ×3 IMPLANT
DRSG TELFA 3X8 NADH (GAUZE/BANDAGES/DRESSINGS) ×3 IMPLANT
ELECT CAUTERY BLADE 6.4 (BLADE) ×3 IMPLANT
ELECT REM PT RETURN 9FT ADLT (ELECTROSURGICAL) ×3
ELECTRODE REM PT RTRN 9FT ADLT (ELECTROSURGICAL) ×1 IMPLANT
GAUZE SPONGE 4X4 12PLY STRL (GAUZE/BANDAGES/DRESSINGS) ×3 IMPLANT
GLOVE BIO SURGEON STRL SZ8 (GLOVE) ×3 IMPLANT
GOWN STRL REUS W/ TWL LRG LVL3 (GOWN DISPOSABLE) ×2 IMPLANT
GOWN STRL REUS W/ TWL XL LVL3 (GOWN DISPOSABLE) ×1 IMPLANT
GOWN STRL REUS W/TWL LRG LVL3 (GOWN DISPOSABLE) ×4
GOWN STRL REUS W/TWL XL LVL3 (GOWN DISPOSABLE) ×2
NS IRRIG 1000ML POUR BTL (IV SOLUTION) ×3 IMPLANT
PACK C SECTION AR (MISCELLANEOUS) ×3 IMPLANT
PAD OB MATERNITY 4.3X12.25 (PERSONAL CARE ITEMS) ×3 IMPLANT
PAD PREP 24X41 OB/GYN DISP (PERSONAL CARE ITEMS) ×3 IMPLANT
STAPLER INSORB 30 2030 C-SECTI (MISCELLANEOUS) ×3 IMPLANT
STRAP SAFETY BODY (MISCELLANEOUS) ×3 IMPLANT
SUCT VACUUM KIWI BELL (SUCTIONS) ×3 IMPLANT
SUT CHROMIC 1 CTX 36 (SUTURE) ×9 IMPLANT
SUT PLAIN GUT 0 (SUTURE) ×6 IMPLANT
SUT VIC AB 0 CT1 36 (SUTURE) ×6 IMPLANT

## 2016-10-11 NOTE — Anesthesia Procedure Notes (Signed)
Spinal  Patient location during procedure: OB Start time: 10/11/2016 4:05 PM End time: 10/11/2016 4:15 PM Staffing Anesthesiologist: Randa Lynn, AMY Resident/CRNA: Nelda Marseille Performed: resident/CRNA  Preanesthetic Checklist Completed: patient identified, site marked, surgical consent, pre-op evaluation, timeout performed, IV checked, risks and benefits discussed and monitors and equipment checked Spinal Block Patient position: sitting Prep: ChloraPrep Patient monitoring: heart rate, continuous pulse ox, blood pressure and cardiac monitor Approach: midline Location: L4-5 Injection technique: single-shot Needle Needle type: Introducer and Pencan  Needle gauge: 24 G Needle length: 9 cm Assessment Sensory level: T4 Additional Notes Negative paresthesia. Negative blood return. Positive free-flowing CSF. Expiration date of kit checked and confirmed. Patient tolerated procedure well, without complications.

## 2016-10-11 NOTE — Anesthesia Preprocedure Evaluation (Addendum)
Anesthesia Evaluation  Patient identified by MRN, date of birth, ID band Patient awake    Reviewed: Allergy & Precautions, NPO status , Patient's Chart, lab work & pertinent test results  History of Anesthesia Complications Negative for: history of anesthetic complications  Airway Mallampati: II  TM Distance: >3 FB Neck ROM: Full    Dental no notable dental hx.    Pulmonary neg pulmonary ROS, neg COPD, neg PE   breath sounds clear to auscultation- rhonchi (-) wheezing      Cardiovascular Exercise Tolerance: Good (-) hypertension(-) CAD and (-) Past MI  Rhythm:Regular Rate:Normal - Systolic murmurs and - Diastolic murmurs    Neuro/Psych negative neurological ROS  negative psych ROS   GI/Hepatic negative GI ROS, Neg liver ROS,   Endo/Other  negative endocrine ROSneg diabetes  Renal/GU negative Renal ROS     Musculoskeletal negative musculoskeletal ROS (+)   Abdominal (+) + obese, Gravid abdomen  Peds  Hematology negative hematology ROS (+)   Anesthesia Other Findings C-section for failure to progress, reassuring fetal monitoring, no decels  Reproductive/Obstetrics (+) Pregnancy                             Lab Results  Component Value Date   WBC 8.9 10/11/2016   HGB 12.0 10/11/2016   HCT 34.8 (L) 10/11/2016   MCV 92.0 10/11/2016   PLT 285 10/11/2016    Anesthesia Physical Anesthesia Plan  ASA: II  Anesthesia Plan: Spinal   Post-op Pain Management:    Induction:   Airway Management Planned:   Additional Equipment:   Intra-op Plan:   Post-operative Plan:   Informed Consent: I have reviewed the patients History and Physical, chart, labs and discussed the procedure including the risks, benefits and alternatives for the proposed anesthesia with the patient or authorized representative who has indicated his/her understanding and acceptance.     Plan Discussed with: CRNA and  Anesthesiologist  Anesthesia Plan Comments:         Anesthesia Quick Evaluation

## 2016-10-11 NOTE — H&P (Signed)
Alexandria Snyder is a 40 y.o. at 4640+6 weeks  female presenting for labor . SROM + mec . Neg GBS . OB History    Gravida Para Term Preterm AB Living   1             SAB TAB Ectopic Multiple Live Births                 Past Medical History:  Diagnosis Date  . Medical history non-contributory    Past Surgical History:  Procedure Laterality Date  . NO PAST SURGERIES     Family History: family history is not on file. Social History:  reports that she has never smoked. She has never used smokeless tobacco. She reports that she does not drink alcohol or use drugs.     Maternal Diabetes: No Genetic Screening: Normal no record ? Done  Maternal Ultrasounds/Referrals: Normal Fetal Ultrasounds or other Referrals:  Nl Maternal Substance Abuse:  No Significant Maternal Medications:  None Significant Maternal Lab Results:  None Other Comments:  None  ROS History Dilation: 6.5 Effacement (%): 90 Station: -2 Exam by:: K[facility] RN Blood pressure 131/75, pulse 74, temperature 98.2 F (36.8 C), temperature source Oral, resp. rate 18, height 5\' 2"  (1.575 m), weight 192 lb (87.1 kg), last menstrual period 12/30/2015.  Exam by TJS at 0725 : 5 cm / 90/ 0 forebag AROM thick mec . IUPC + FSE placed  CTX q 4  Exam  Lungs CTA  CV RRR adb : gravid  efw 8/0 # Physical Exam  Prenatal labs: ABO, Rh: O+Antibody: PENDING (11/17 0458) Rubella: Immune (06/07 0000) RPR: Nonreactive (06/07 0000)  HBsAg: Negative (06/07 0000)  HIV: Non-reactive (06/07 0000)  GBS: Negative (10/19 0000)  + chlamydia---> TOC neg   Assessment/Plan: Active labor  Reassuring fetal monitoring  Protracted Active labor Add pitocin    Alexandria Snyder 10/11/2016, 7:29 AM

## 2016-10-11 NOTE — Brief Op Note (Signed)
10/11/2016  5:15 PM  PATIENT:  Alexandria Snyder  40 y.o. female  PRE-OPERATIVE DIAGNOSIS: active phase arrest POST-OPERATIVE DIAGNOSIS: active phase arrest PROCEDURE:  Procedure(s): CESAREAN SECTION (N/A) LTCS SURGEON:  Surgeon(s) and Role:    Suzy Bouchard* Thomas J Schermerhorn, MD - Primary  PHYSICIAN ASSISTANT: Channing MuttersLori Brame , scrub tech   ASSISTANTS: none none  ANESTHESIA:   spinal  EBL:  Total I/O In: 921 [I.V.:921] Out: 700 [Urine:200; Blood:500]  BLOOD ADMINISTERED:none  DRAINS: Urinary Catheter (Foley)   LOCAL MEDICATIONS USED:  NONE  SPECIMEN:  Source of Specimen:  placenta  DISPOSITION OF SPECIMEN:  PATHOLOGY  COUNTS:  YES  TOURNIQUET:  * No tourniquets in log *  DICTATION: .Other Dictation: Dictation Number verbal  PLAN OF CARE: Admit to inpatient   PATIENT DISPOSITION:  PACU - hemodynamically stable.   Delay start of Pharmacological VTE agent (>24hrs) due to surgical blood loss or risk of bleeding: not applicable

## 2016-10-11 NOTE — Transfer of Care (Signed)
Immediate Anesthesia Transfer of Care Note  Patient: Alexandria Snyder  Procedure(s) Performed: Procedure(s): CESAREAN SECTION (N/A)  Patient Location: PACU and Mother/Baby  Anesthesia Type:Spinal  Level of Consciousness: awake, alert  and oriented  Airway & Oxygen Therapy: Patient Spontanous Breathing  Post-op Assessment: Post -op Vital signs reviewed and stable  Post vital signs: stable  Last Vitals:  Vitals:   10/11/16 1550 10/11/16 1713  BP: (!) 153/77 123/76  Pulse: 70 76  Resp:  12  Temp:  36.6 C    Last Pain:  Vitals:   10/11/16 1713  TempSrc: Oral  PainSc:          Complications: No apparent anesthesia complications

## 2016-10-11 NOTE — OB Triage Note (Signed)
Patient arrived in triage with complaints of contractions since approx 0200, leaking of fluid since 0400, and vaginal bleeding. Patient states baby is moving as normal. EFM initiated. Discussed plan of care. Patient verbalized understanding.

## 2016-10-11 NOTE — Progress Notes (Signed)
Alexandria Snyder is a 40 y.o. G1P0 at 6920w6d by  Subjective: Ctx , pitocin at 16 mu/ min    Objective: BP (!) 146/80   Pulse 73   Temp 97.6 F (36.4 C) (Oral)   Resp 18   Ht 5\' 2"  (1.575 m)   Wt 192 lb (87.1 kg)   LMP 12/30/2015 (Exact Date)   BMI 35.12 kg/m  I/O last 3 completed shifts: In: 218.8 [I.V.:218.8] Out: -  Total I/O In: 521 [I.V.:521] Out: -   FHT:  FHR: 130 bpm, variability: moderate,  accelerations:  Present,  decelerations:  Absent UC:   regular, every 2-3 minutes SVE:   Dilation: 6 Effacement (%): 90 Station: 0 Exam by:: TJS MD No significant change from 7 hrs earlier Labs: Lab Results  Component Value Date   WBC 8.9 10/11/2016   HGB 12.0 10/11/2016   HCT 34.8 (L) 10/11/2016   MCV 92.0 10/11/2016   PLT 285 10/11/2016    Assessment / Plan: Active phase arrest  Spoke to pt and family regarding need for cesarean . Risks discussed . All questions answered . Translator used .  Alexandria Snyder 10/11/2016, 3:12 PM

## 2016-10-11 NOTE — Anesthesia Procedure Notes (Signed)
Date/Time: 10/11/2016 4:22 PM Performed by: Junious SilkNOLES, Dalaney Needle Pre-anesthesia Checklist: Patient identified, Emergency Drugs available, Suction available, Patient being monitored and Timeout performed Oxygen Delivery Method: Nasal cannula

## 2016-10-11 NOTE — Discharge Summary (Signed)
Obstetric Discharge Summary Reason for Admission: onset of labor Prenatal Procedures: none Intrapartum Procedures: cesarean: low cervical, transverse for Active phase arrest  Female delivered on 10/11/16 at 1637  Weight 4200 gm Postpartum Procedures: none Complications-Operative and Postpartum: none Hemoglobin  Date Value Ref Range Status  10/12/2016 9.0 (L) 12.0 - 16.0 g/dL Final   HCT  Date Value Ref Range Status  10/12/2016 26.3 (L) 35.0 - 47.0 % Final    Physical Exam:  General: alert and cooperative Lochia: appropriate Uterine Fundus: firm Incision: healing well DVT Evaluation: No evidence of DVT seen on physical exam.  Discharge Diagnoses: Term Pregnancy-delivered, Active phase arrest   Discharge Information: Date: 10/14/2016 Activity: pelvic rest Diet: routine Medications: Ibuprofen and Percocet Condition: stable Instructions: refer to practice specific booklet Discharge to: home Follow-up Information    SCHERMERHORN,THOMAS, MD Follow up in 2 week(s).   Specialty:  Obstetrics and Gynecology Contact information: [address] [facility] [place] [address] [phone]        [facility] up in 6 week(s).   Specialty:  General Practice Why:  for routine post partum exam Contact information: [address] [place] [address] [phone]           Newborn Data: Live born female  Birth Weight: 9 lb 4.2 oz (4200 g) APGAR: 9, 10  Home with mother.  Alexandria Snyder 10/14/2016, 1:13 PM

## 2016-10-12 LAB — CBC
HEMATOCRIT: 26.3 % — AB (ref 35.0–47.0)
HEMOGLOBIN: 9 g/dL — AB (ref 12.0–16.0)
MCH: 32.2 pg (ref 26.0–34.0)
MCHC: 34.4 g/dL (ref 32.0–36.0)
MCV: 93.8 fL (ref 80.0–100.0)
Platelets: 253 10*3/uL (ref 150–440)
RBC: 2.81 MIL/uL — ABNORMAL LOW (ref 3.80–5.20)
RDW: 13.8 % (ref 11.5–14.5)
WBC: 9.8 10*3/uL (ref 3.6–11.0)

## 2016-10-12 LAB — RPR: RPR: NONREACTIVE

## 2016-10-12 NOTE — Op Note (Signed)
NAME:  Alexandria LevanSANTIAGO Snyder, Alexandria   ACCOUNT NO.:  [id number]  MEDICAL RECORD NO.:  [id number]  LOCATION:  LDR1                         FACILITY:  [facility]  PHYSICIAN:  Jennell Cornerhomas Schermerhorn, MDDATE OF BIRTH:  11-29-1975  DATE OF PROCEDURE: DATE OF DISCHARGE:                              OPERATIVE REPORT   PREOPERATIVE DIAGNOSIS:  Active phase arrest.  POSTOPERATIVE DIAGNOSIS:  Active phase arrest.  PROCEDURE PERFORMED: 1. Primary low transverse cesarean section. 2. Vacuum extraction of fetal head.  SURGEON:  Jennell Cornerhomas Schermerhorn, MD  ANESTHESIA:  Spinal.  FIRST ASSISTANT:  Channing MuttersLori Brame, scrub tech.  INDICATIONS:  A 40 year old, gravida 1, para 0.  The patient was admitted early in the morning of the 17th and was ruptured at 7:50 a.m. and internalized with IUPC and FSE.  The patient was on Pitocin throughout the day and did not progress past 6 cm.  When she was ruptured, she was deemed to be 5-6 cm.  Via the translator, I spoke to the patient and her family regarding the need for cesarean section. They have agreed and all questions were answered.  Risks of the procedure were described as well.  DESCRIPTION OF PROCEDURE:  After adequate spinal anesthesia, the patient was placed in dorsal supine position.  Hip rolled on the right side. The patient's abdomen was prepped and draped in normal sterile fashion. She received 2 g IV Ancef and 500 mg of azithromycin for surgical prophylaxis given laboring, the patient greater than 4 hours.  Time-out was performed.  A Pfannenstiel incision was made 2 fingerbreadths above the symphysis pubis.  Sharp dissection was used to identify the fascia. Fascia was opened in midline and opened in a transverse fashion.  The superior aspect of the fascia was grasped with Kocher clamps.  The recti muscles were dissected free.  Inferior aspect of the fascia was grasped with Kocher clamps, pyramidalis muscles dissected free.  Entry into the peritoneal cavity  was accomplished sharply.  The vesicular uterine peritoneal fold was identified and opened and a bladder flap was created.  The bladder was reflected inferiorly.  A low transverse uterine incision was made upon entry into the endometrial cavity. Meconium-stained fluid was noted.  The incision was extended with blunt transverse traction.  The large fetal head with some caput was brought to the incision and a kiwi vacuum was applied to the fetal occiput with 1 pull.  The head was delivered.  The vacuum was removed and the shoulders and body were delivered without difficulty.  A large female was placed on the abdomen and dried.  Vigorous crying from initial _____ uterus, neonatologist standing by.  After 60 seconds, the cord was doubly clamped and passed to the neonatologist who assigned Apgar scores of 9 and 10.  Extremely large placenta was then delivered and the uterus was then exteriorized.  The endometrial cavity was wiped clean and meconium-stained membranes were swept from the uterus.  The uterine incision was then closed with 1 chromic suture in a running locking fashion.  Several addition additional figure-of-eight sutures were required for hemostasis.  The uterus was somewhat atonic and the patient required 0.2 mg intramuscular methargen to increase the tone while the Pitocin was running through the IV line.  Fallopian tubes and ovaries  appeared normal.  The posterior cul-de-sac was irrigated and suctioned and the uterus was placed back in the abdominal cavity.  The pericolic gutters were wiped clean with laparotomy tape bilaterally.  Uterine incision again appeared hemostatic and Interceed was placed over the uterine incision in a T-shaped fashion.  The fascia was then closed with 0 Vicryl suture in a running nonlocking fashion, good approximation of edges.  Subcutaneous tissues were irrigated and bovied for hemostasis and the skin was reapproximated with Insorb absorbable staples.   Good cosmetic effect.  Good hemostasis noted.  TIME OF BIRTH:  1637 hours.  DATE:  October 11, 2016.  FETAL WEIGHT:  4200 g.  ESTIMATED BLOOD LOSS:  500 mL.  INTRAOPERATIVE FLUIDS:  1200 mL.  The patient was taken to recovery room in good condition.          ______________________________ Jennell Cornerhomas Schermerhorn, MD     TS/MEDQ  D:  10/11/2016  T:  10/12/2016  Job:  [id number]

## 2016-10-12 NOTE — Progress Notes (Signed)
Subjective: Postpartum Day 1: Cesarean Delivery Patient reports   Objective: Vital signs in last 24 hours: Temp:  [97.5 F (36.4 C)-99.2 F (37.3 C)] 97.5 F (36.4 C) (11/18 0430) Pulse Rate:  [64-80] 78 (11/18 0430) Resp:  [12-21] 18 (11/18 0430) BP: (107-153)/(56-90) 107/61 (11/18 0430) SpO2:  [95 %-99 %] 95 % (11/18 0430)  Physical Exam:  General: A,A&O x 3, Pain is controlled Lochia: mod, no blood on pad, no clots Uterine Fundus: firm Incision: healing well DVT Evaluation: No evidence of DVT seen on physical exam.   Recent Labs  10/11/16 0523 10/12/16 0639  HGB 12.0 9.0*  HCT 34.8* 26.3*    Assessment/Plan: Status post Cesarean section. Doing well postoperatively.  Continue current care. Dressing removed and incision intact.  IV continues and will remove today  Sharee PimpleCaron W Jones 10/12/2016, 9:35 AM

## 2016-10-13 NOTE — Anesthesia Post-op Follow-up Note (Signed)
  Anesthesia Pain Follow-up Note  Patient: Alexandria Snyder  Day #: 2  Date of Follow-up: 10/13/2016 Time: 7:59 AM  Last Vitals:  Vitals:   10/13/16 0004 10/13/16 0414  BP: 124/63 122/64  Pulse: 87 79  Resp: 18 20  Temp: 36.7 C 36.8 C    Level of Consciousness: alert  Pain: mild   Side Effects:None  Catheter Site Exam:clean, dry, no drainage     Plan: D/C from anesthesia care  Seher Schlagel

## 2016-10-13 NOTE — Progress Notes (Signed)
Post Partum Day 2, POD#2/LTCS Subjective: no complaints, up ad lib, voiding and tolerating PO  Objective: Blood pressure (!) 123/52, pulse 69, temperature 98.4 F (36.9 C), temperature source Oral, resp. rate 18, height 5\' 2"  (1.575 m), weight 87.1 kg (192 lb), last menstrual period 12/30/2015, SpO2 97 %.  Physical Exam:  General: alert and cooperative  Heart: S1S2, RRR, no M/R/G. Lochia: mod, no clots Uterine Fundus: firm, U-2 Incision: healing well, no edema DVT Evaluation: No evidence of DVT seen on physical exam.   Recent Labs  10/11/16 0523 10/12/16 0639  HGB 12.0 9.0*  HCT 34.8* 26.3*    Assessment/Plan: Plan for discharge tomorrow  FE OTC for anemia   LOS: 2 days   Sharee Pimplearon W Robie Oats 10/13/2016, 11:40 AM

## 2016-10-13 NOTE — Anesthesia Postprocedure Evaluation (Signed)
Anesthesia Post Note  Patient: Alexandria Snyder  Procedure(s) Performed: Procedure(s) (LRB): CESAREAN SECTION (N/A)  Patient location during evaluation: Mother Baby Anesthesia Type: Spinal Level of consciousness: awake and alert and oriented Pain management: pain level controlled Vital Signs Assessment: post-procedure vital signs reviewed and stable Respiratory status: spontaneous breathing Cardiovascular status: blood pressure returned to baseline Anesthetic complications: no    Last Vitals:  Vitals:   10/13/16 0004 10/13/16 0414  BP: 124/63 122/64  Pulse: 87 79  Resp: 18 20  Temp: 36.7 C 36.8 C    Last Pain:  Vitals:   10/13/16 0700  TempSrc:   PainSc: (P) 4                  Isis Costanza

## 2016-10-14 ENCOUNTER — Encounter: Payer: Self-pay | Admitting: Obstetrics and Gynecology

## 2016-10-14 MED ORDER — IBUPROFEN 600 MG PO TABS: 600.0000 mg | ORAL_TABLET | Freq: Four times a day (QID) | ORAL | 0 refills | Status: AC

## 2016-10-14 MED ORDER — ACETAMINOPHEN 325 MG PO TABS: 650.0000 mg | ORAL_TABLET | ORAL | 1 refills | Status: AC

## 2016-10-14 MED ORDER — OXYCODONE HCL 5 MG PO TABS: 5.0000 mg | ORAL_TABLET | ORAL | 0 refills | Status: AC | PRN

## 2016-10-14 NOTE — Discharge Instructions (Signed)
Discharge instructions:   Call office if you have any of the following: headache, visual changes, fever >101.0 F, chills, breast concerns, excessive vaginal bleeding, incision drainage or problems, leg pain or redness, depression or any other concerns.   Activity: Do not lift > 10 lbs for 6 weeks.  No intercourse or tampons for 6 weeks.  No driving for 1-2 weeks.   Call your doctor for increased pain or vaginal bleeding, temperature above 101.0, depression, incisional issues, or concerns.  No strenuous activity or heavy lifting for 6 weeks.  No intercourse, tampons, douching, or enemas for 6 weeks.  No tub baths-showers only.  No driving for 2 weeks or while taking pain medications.  Continue prenatal vitamin and iron.  Increase calories and fluids while breastfeeding.  You may have a slight fever when your milk comes in, but it should go away on its own.  If it does not, and rises above 101.0 please call the doctor.  For concerns about your baby, please call your pediatrician For breastfeeding concerns, the lactation consultant can be reached at [phone]

## 2016-10-15 LAB — SURGICAL PATHOLOGY

## 2017-08-16 ENCOUNTER — Encounter (HOSPITAL_COMMUNITY): Payer: Self-pay

## 2019-09-27 ENCOUNTER — Other Ambulatory Visit: Payer: Self-pay | Admitting: Family Medicine

## 2019-09-27 DIAGNOSIS — O24419 Gestational diabetes mellitus in pregnancy, unspecified control: Secondary | ICD-10-CM

## 2019-09-30 ENCOUNTER — Ambulatory Visit
Admission: RE | Admit: 2019-09-30 | Discharge: 2019-09-30 | Disposition: A | Discharge status: Home / Self Care | Payer: Medicaid Other | Source: Ambulatory Visit | Attending: Family Medicine | Admitting: Family Medicine

## 2019-09-30 ENCOUNTER — Other Ambulatory Visit: Payer: Self-pay

## 2019-09-30 DIAGNOSIS — O24419 Gestational diabetes mellitus in pregnancy, unspecified control: Secondary | ICD-10-CM | POA: Insufficient documentation

## 2019-10-07 ENCOUNTER — Other Ambulatory Visit: Payer: Self-pay | Admitting: Family Medicine

## 2019-10-07 DIAGNOSIS — Z3689 Encounter for other specified antenatal screening: Secondary | ICD-10-CM

## 2019-10-07 DIAGNOSIS — O24813 Other pre-existing diabetes mellitus in pregnancy, third trimester: Secondary | ICD-10-CM

## 2020-12-24 IMAGING — US US OB COMP +14 WK
1 series · 13 of 28 positions shown · non-contrast
Comparison: none

CLINICAL DATA: Evaluate for growth. Size greater than dates.
Patient is 43-year-old. By assigned EDC of 10/23/2019 the patient is
37 weeks 6 days. Previous exams were performed at [HOSPITAL].

EXAM:
OBSTETRICAL ULTRASOUND >14 WKS

[Series 1: us ob comp +14 wk · 13 of 48 slices shown]
[im 2/48]
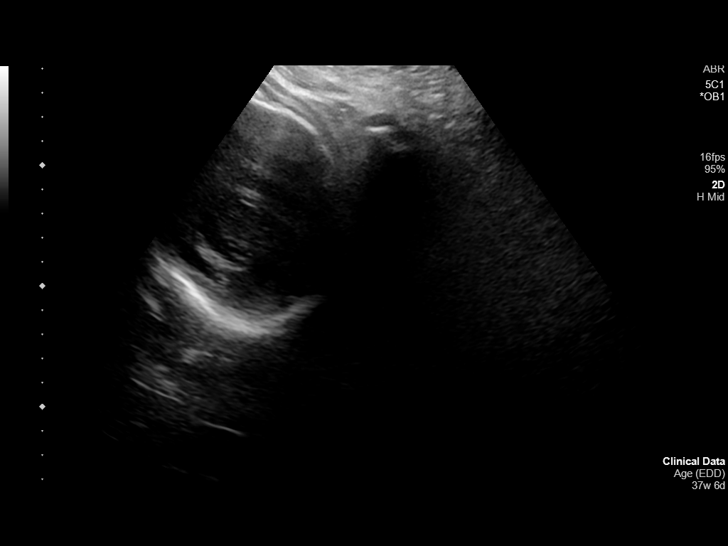
[im 6/48]
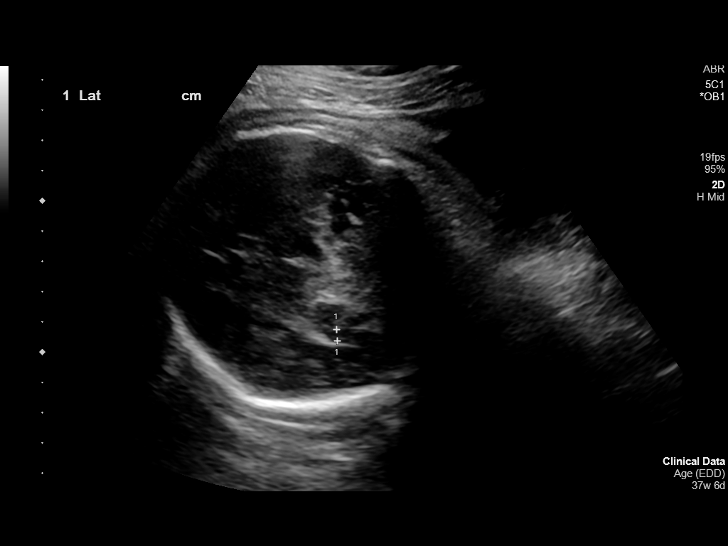
[im 9/48]
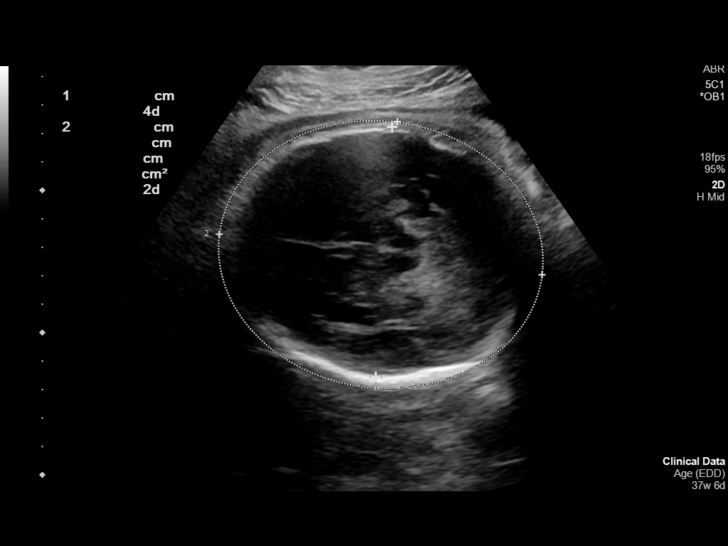
[im 13/48]
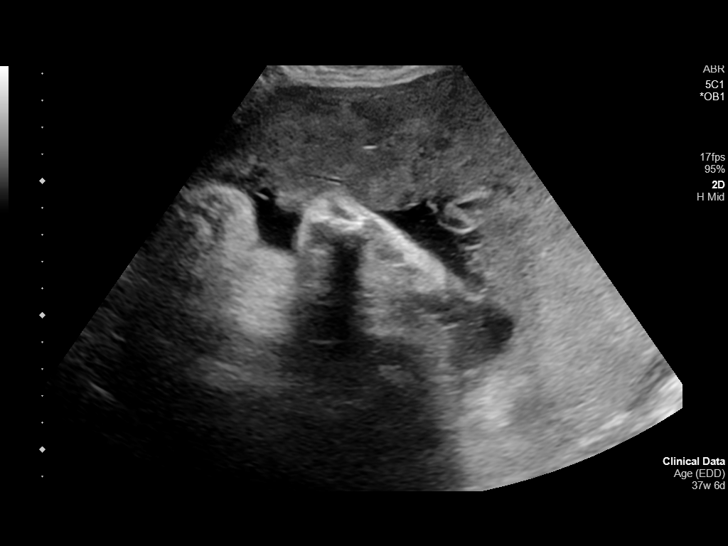
[im 16/48]
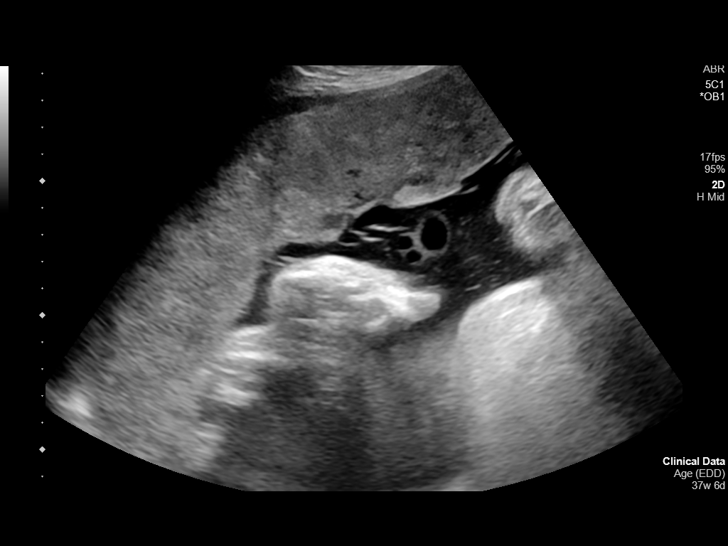
[im 20/48]
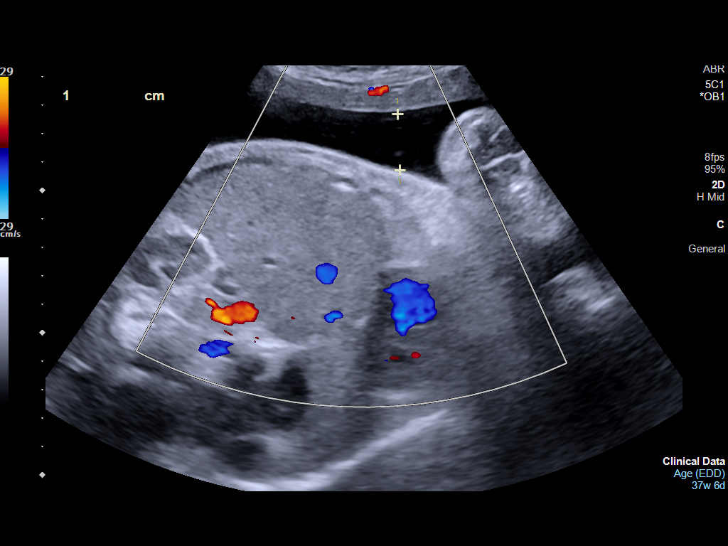
[im 25/48]
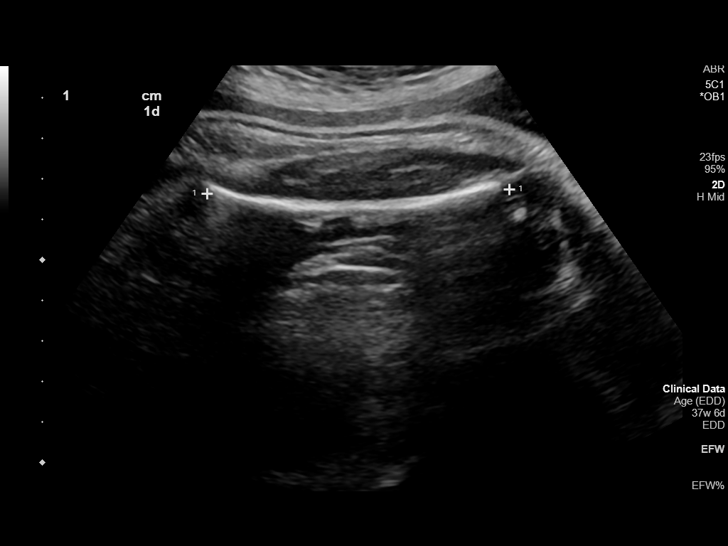
[im 28/48]
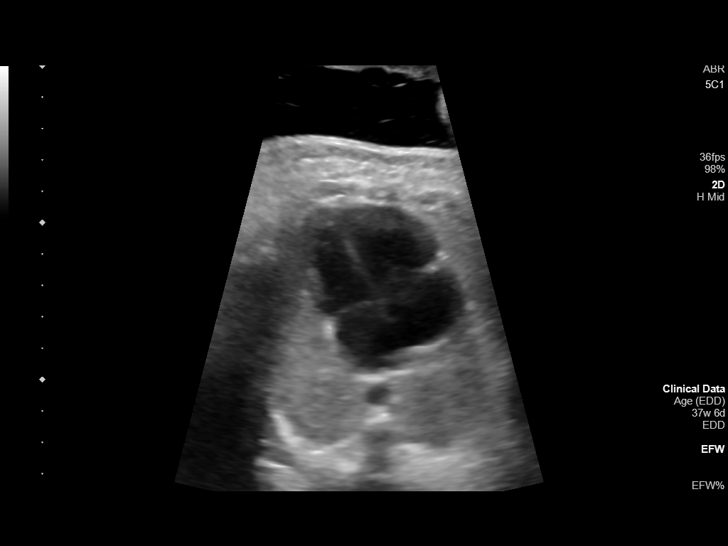
[im 32/48]
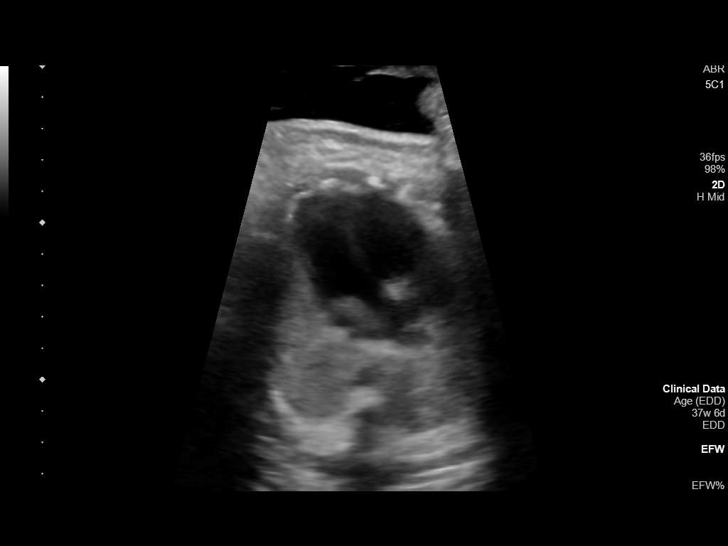
[im 35/48]
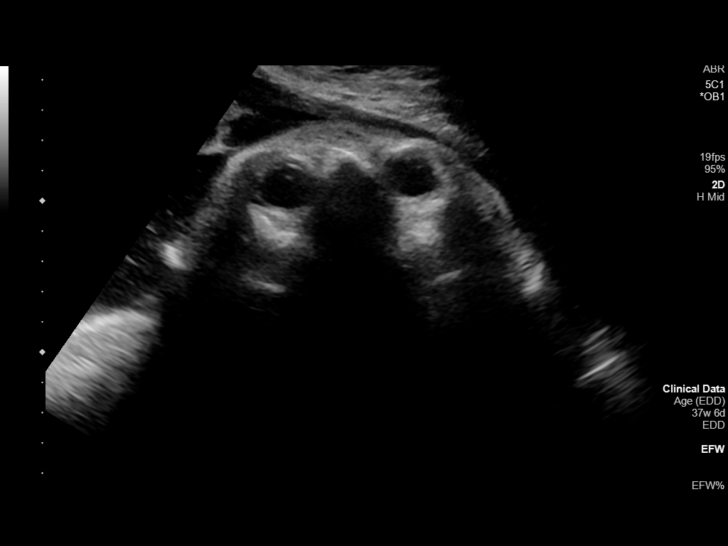
[im 39/48]
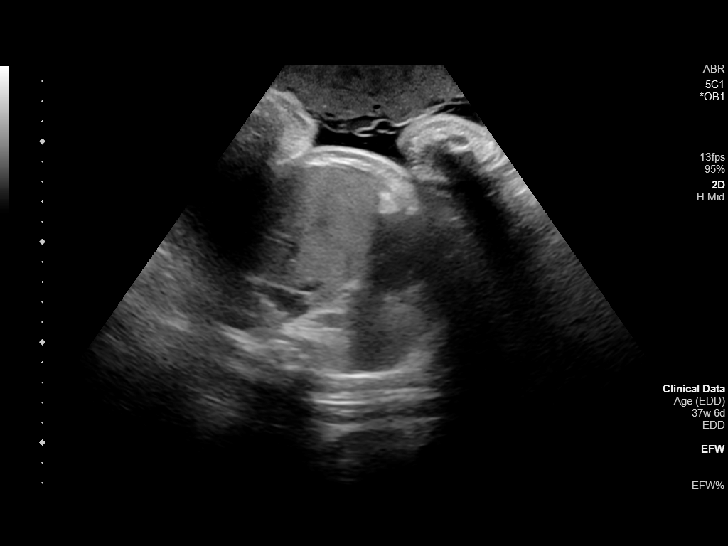
[im 42/48]
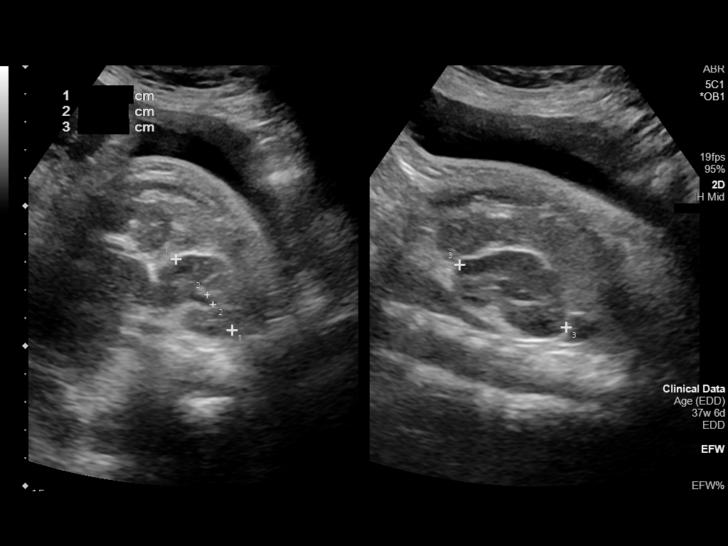
[im 46/48]
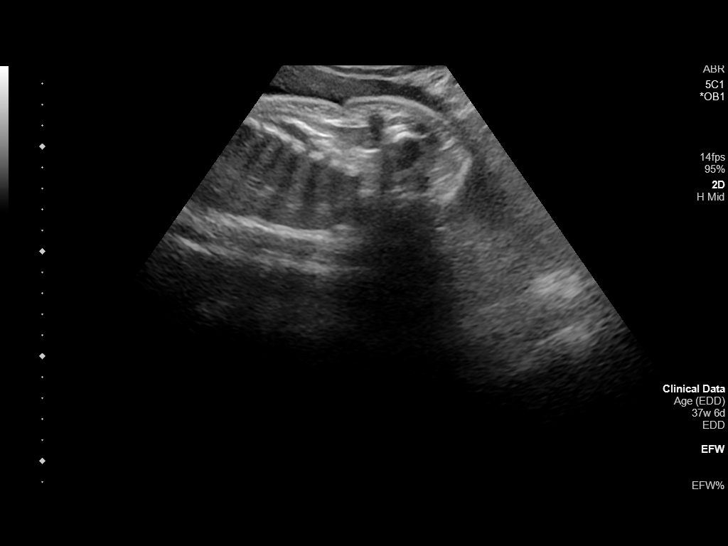

[13 of 28 positions shown; findings below may reference images not displayed]

FINDINGS: Number of Fetuses: 1

Heart Rate:  139 bpm

Movement: Present

Presentation: Cephalic

Previa: None

Placental Location: Anterior

Amniotic Fluid (Subjective): Normal

Amniotic Fluid (Objective):

AFI = 15.7 cm (5%ile= 7.5 cm, 95%= 24.4 cm for 37 wks)

FETAL BIOMETRY

BPD: 8.74cm 35w 2d

HC:   33.08cm 37w 5d

AC:   41.58cm out of range

FL:   7.61cm 38w 6d

Current Mean GA: 37w 3d US EDC: 10/18/2019

Assigned GA:  37w 6d Assigned EDC: 10/15/2019

Estimated Fetal Weight:  4558g > 95%ile

FETAL ANATOMY

Lateral Ventricles: Appears normal

Thalami/CSP: Appears normal

Posterior Fossa:  Not well visualized

Nuchal Region: Not well visualized   NFT= not applicable

Upper Lip: Appears normal

Spine: Not well visualized

4 Chamber Heart on Left: Appears normal

LVOT: Appears normal

RVOT: Not visualized

Stomach on Left: Appears normal

3 Vessel Cord: Appears normal

Cord Insertion site: Not visualized

Kidneys: Appears normal

Bladder: Appears normal

Extremities: Not well visualized

Sex: Male

Technically difficult due to: Advanced gestational age and fetal
size.

Maternal Findings:

Cervix:  Not assessed
IMPRESSION: 1. Single living intrauterine fetus in cephalic presentation.
2. Amniotic fluid volume is normal.
3. Large for gestational age fetus. The estimated fetal weight is
greater than 95th percentile, 6668 grams.
4. The abdominal circumference is out of range.

## 2022-10-11 ENCOUNTER — Other Ambulatory Visit: Payer: Self-pay

## 2022-10-11 DIAGNOSIS — Z1231 Encounter for screening mammogram for malignant neoplasm of breast: Secondary | ICD-10-CM

## 2022-10-15 ENCOUNTER — Inpatient Hospital Stay: Admission: RE | Admit: 2022-10-15 | Payer: Medicaid Other | Source: Ambulatory Visit

## 2023-06-06 ENCOUNTER — Ambulatory Visit
Admission: EM | Admit: 2023-06-06 | Discharge: 2023-06-06 | Disposition: A | Discharge status: Home / Self Care | Payer: BC Managed Care – PPO | Attending: Family Medicine | Admitting: Family Medicine

## 2023-06-06 DIAGNOSIS — N390 Urinary tract infection, site not specified: Secondary | ICD-10-CM | POA: Insufficient documentation

## 2023-06-06 LAB — URINALYSIS, W/ REFLEX TO CULTURE (INFECTION SUSPECTED)
Bilirubin Urine: NEGATIVE
Glucose, UA: 250 mg/dL — AB
Ketones, ur: NEGATIVE mg/dL
Nitrite: NEGATIVE
Protein, ur: 100 mg/dL — AB
RBC / HPF: >50 RBC/hpf (ref 0–5)
Specific Gravity, Urine: 1.015 (ref 1.005–1.030)
pH: 6 (ref 5.0–8.0)

## 2023-06-06 LAB — WET PREP, GENITAL
Clue Cells Wet Prep HPF POC: NONE SEEN
Sperm: NONE SEEN
Trich, Wet Prep: NONE SEEN
WBC, Wet Prep HPF POC: <10 (ref <10)
Yeast Wet Prep HPF POC: NONE SEEN

## 2023-06-06 MED ORDER — PHENAZOPYRIDINE HCL 200 MG PO TABS: 200.0000 mg | ORAL_TABLET | Freq: Three times a day (TID) | ORAL | 0 refills | Status: AC

## 2023-06-06 MED ORDER — NITROFURANTOIN MONOHYD MACRO 100 MG PO CAPS: 100.0000 mg | ORAL_CAPSULE | Freq: Two times a day (BID) | ORAL | 0 refills | Status: AC

## 2023-06-06 NOTE — ED Provider Notes (Signed)
[facility]    CSN: [id number] Arrival date & time: 06/06/23  1133      History   Chief Complaint Chief Complaint  Patient presents with   Abdominal Pain    HPI Janautica Lilleigh Cryder is a 47 y.o. female.   HPI  47 year old Spanish-speaking female with no significant past medical history presents for evaluation of suprapubic abdominal pain and vaginal discomfort that started last night.  She also endorses pain with urination along with urinary urgency and frequency.  She has seen blood when she wipes after urination.  She denies any fever, nausea, vomiting, back pain, vaginal discharge, or vaginal itching.  Spanish interpreter Kandis Mannan [phone] used for history and physical.  Past Medical History:  Diagnosis Date   Medical history non-contributory     Patient Active Problem List   Diagnosis Date Noted   Pregnancy 10/11/2016   Post-operative state 10/11/2016    Past Surgical History:  Procedure Laterality Date   CESAREAN SECTION N/A 10/11/2016   Procedure: CESAREAN SECTION;  Surgeon: Suzy Bouchard, MD;  Location: [facility] ORS;  Service: Obstetrics;  Laterality: N/A;   NO PAST SURGERIES      OB History     Gravida  1   Para      Term      Preterm      AB      Living         SAB      IAB      Ectopic      Multiple      Live Births               Home Medications    Prior to Admission medications   Medication Sig Start Date End Date Taking? Authorizing Provider  nitrofurantoin, macrocrystal-monohydrate, (MACROBID) 100 MG capsule Take 1 capsule (100 mg total) by mouth 2 (two) times daily. 06/06/23  Yes Becky Augusta, NP  phenazopyridine (PYRIDIUM) 200 MG tablet Take 1 tablet (200 mg total) by mouth 3 (three) times daily. 06/06/23  Yes Becky Augusta, NP  acetaminophen (TYLENOL) 325 MG tablet Take 2 tablets (650 mg total) by mouth every 4 (four) hours. 10/14/16   Ward, Elenora Fender, MD  ibuprofen (ADVIL,MOTRIN) 600 MG tablet Take 1  tablet (600 mg total) by mouth every 6 (six) hours. 10/14/16   Ward, Elenora Fender, MD  oxyCODONE (OXY IR/ROXICODONE) 5 MG immediate release tablet Take 1 tablet (5 mg total) by mouth every 4 (four) hours as needed for severe pain (pain scale 4-7). 10/14/16   Ward, Elenora Fender, MD  Prenatal Vit-Fe Fumarate-FA (MULTIVITAMIN-PRENATAL) 27-0.8 MG TABS tablet Take 1 tablet by mouth daily at 12 noon.    [provider]    Family History History reviewed. No pertinent family history.  Social History Social History   Tobacco Use   Smoking status: Never   Smokeless tobacco: Never  Substance Use Topics   Alcohol use: No   Drug use: No     Allergies   Patient has no known allergies.   Review of Systems Review of Systems  Constitutional:  Negative for fever.  Gastrointestinal:  Positive for abdominal pain. Negative for nausea and vomiting.  Genitourinary:  Positive for dysuria, frequency, hematuria and urgency. Negative for vaginal discharge and vaginal pain.  Musculoskeletal:  Negative for back pain.     Physical Exam Triage Vital Signs ED Triage Vitals  Encounter Vitals Group     BP      Systolic BP  Percentile      Diastolic BP Percentile      Pulse      Resp      Temp      Temp src      SpO2      Weight      Height      Head Circumference      Peak Flow      Pain Score      Pain Loc      Pain Education      Exclude from Growth Chart    No data found.  Updated Vital Signs BP 116/74 (BP Location: Right Arm)   Pulse 95   Temp 99.4 F (37.4 C) (Oral)   Resp 16   LMP 05/29/2023   SpO2 98%   Visual Acuity Right Eye Distance:   Left Eye Distance:   Bilateral Distance:    Right Eye Near:   Left Eye Near:    Bilateral Near:     Physical Exam Vitals and nursing note reviewed.  Constitutional:      Appearance: Normal appearance. She is not ill-appearing.  HENT:     Head: Normocephalic and atraumatic.  Cardiovascular:     Rate and Rhythm: Normal rate and  regular rhythm.     Pulses: Normal pulses.     Heart sounds: Normal heart sounds. No murmur heard.    No friction rub. No gallop.  Pulmonary:     Effort: Pulmonary effort is normal.     Breath sounds: Normal breath sounds. No wheezing, rhonchi or rales.  Abdominal:     Tenderness: There is no right CVA tenderness or left CVA tenderness.  Skin:    General: Skin is warm and dry.     Capillary Refill: Capillary refill takes less than 2 seconds.  Neurological:     General: No focal deficit present.     Mental Status: She is alert and oriented to person, place, and time.      UC Treatments / Results  Labs (all labs ordered are listed, but only abnormal results are displayed) Labs Reviewed  URINALYSIS, W/ REFLEX TO CULTURE (INFECTION SUSPECTED) - Abnormal; Notable for the following components:      Result Value   APPearance CLOUDY (*)    Glucose, UA 250 (*)    Hgb urine dipstick LARGE (*)    Protein, ur 100 (*)    Leukocytes,Ua MODERATE (*)    Bacteria, UA FEW (*)    All other components within normal limits  WET PREP, GENITAL  URINE CULTURE    EKG   Radiology No results found.  Procedures Procedures (including critical care time)  Medications Ordered in UC Medications - No data to display  Initial Impression / Assessment and Plan / UC Course  I have reviewed the triage vital signs and the nursing notes.  Pertinent labs & imaging results that were available during my care of the patient were reviewed by me and considered in my medical decision making (see chart for details).   Patient is a nontoxic-appearing 47 year old female presenting for evaluation of suprapubic abdominal pain with vaginal discomfort and urinary symptoms as outlined HPI above.  She states she has had UTIs in the past but this pain is more intense.  She states she is having more pain in her vaginal area but she denies any discharge or itching.  She has no CVA tenderness on exam.  Her abdomen is full  soft and nontender.  I will  order a urinalysis to look for the presence of UTI as well as vaginal wet prep to look for the presence of BV or yeast.  She denies any concern for sexually transmitted infections and she is in a monogamous relationship with her husband.  Vaginal wet prep is negative.  Urinalysis shows a cloudy appearance with 250 glucose, large hemoglobin, 100 protein, and moderate leukocyte esterase.  Negative for ketones or nitrates.  Reflex microscopy shows greater than 50 RBCs, 11-20 WBCs, and few bacteria.  Urine will reflex to culture.  I will discharge patient on the diagnosis of urinary tract infection and treat her with Macrobid 100 mg twice daily for 5 days.  I will also prescribe Pyridium to help with urinary discomfort.   Final Clinical Impressions(s) / UC Diagnoses   Final diagnoses:  Lower urinary tract infectious disease     Discharge Instructions      [facility] veces al da durante 5 das con alimentos para el tratamiento de la infeccin del tracto urinario.  Utilice Pyridium cada 8 horas segn sea necesario para las [place]. Esto har que su orina adquiera un color rojo anaranjado brillante.  Aumente su ingesta de lquidos orales para aumentar la produccin de [place] o enjuagar su sistema urinario.  Tome un probitico de venta libre, como [employer], 1 hora despus de cada dosis de antibitico para evitar que se formen diarrea o infecciones por hongos.  Realizaremos cultivo de [place] y [employer] antibiticos si es necesario.  Regrese para una reevaluacin o consulte a su proveedor de atencin primaria si presenta algn sntoma nuevo o que empeore.  Take the Macrobid twice daily for 5 days with food for treatment of urinary tract infection.  Use the Pyridium every 8 hours as needed for urinary discomfort.  This will turn your urine a bright red-orange.  Increase your oral fluid intake so that you increase your urine  production and or flushing your urinary system.  Take an over-the-counter probiotic, such as Culturelle-Align-Activia, 1 hour after each dose of antibiotic to prevent diarrhea or yeast infections from forming.  We will culture urine and change the antibiotics if necessary.  Return for reevaluation, or see your primary care provider, for any new or worsening symptoms.      ED Prescriptions     Medication Sig Dispense Auth. Provider   nitrofurantoin, macrocrystal-monohydrate, (MACROBID) 100 MG capsule Take 1 capsule (100 mg total) by mouth 2 (two) times daily. 10 capsule Becky Augusta, NP   phenazopyridine (PYRIDIUM) 200 MG tablet Take 1 tablet (200 mg total) by mouth 3 (three) times daily. 6 tablet Becky Augusta, NP      PDMP not reviewed this encounter.   Becky Augusta, NP 06/06/23 1222

## 2023-06-06 NOTE — Discharge Instructions (Addendum)
[employer] veces al da durante 5 das con alimentos para el tratamiento de la infeccin del tracto urinario.  Utilice Pyridium cada 8 horas segn sea necesario para las [place]. Esto har que su orina adquiera un color rojo anaranjado brillante.  Aumente su ingesta de lquidos orales para aumentar la produccin de [place] o enjuagar su sistema urinario.  Tome un probitico de venta libre, como [place], 1 hora despus de cada dosis de antibitico para evitar que se formen diarrea o infecciones por hongos.  Realizaremos cultivo de [place] y [employer] antibiticos si es necesario.  Regrese para una reevaluacin o consulte a su proveedor de atencin primaria si presenta algn sntoma nuevo o que empeore.  Take the Macrobid twice daily for 5 days with food for treatment of urinary tract infection.  Use the Pyridium every 8 hours as needed for urinary discomfort.  This will turn your urine a bright red-orange.  Increase your oral fluid intake so that you increase your urine production and or flushing your urinary system.  Take an over-the-counter probiotic, such as Culturelle-Align-Activia, 1 hour after each dose of antibiotic to prevent diarrhea or yeast infections from forming.  We will culture urine and change the antibiotics if necessary.  Return for reevaluation, or see your primary care provider, for any new or worsening symptoms.

## 2023-06-06 NOTE — ED Triage Notes (Signed)
Pt present abdomen pain with vaginal discomfort. Pt states symptoms started today. Pt states having frequent urinating

## 2023-06-07 LAB — URINE CULTURE

## 2023-06-08 LAB — URINE CULTURE: Culture: 60000 — AB

## 2024-01-22 ENCOUNTER — Encounter
Admission: RE | Disposition: A | Discharge status: Home / Self Care | Payer: Self-pay | Source: Ambulatory Visit | Attending: Gastroenterology

## 2024-01-22 ENCOUNTER — Ambulatory Visit: Payer: BC Managed Care – PPO | Admitting: Anesthesiology

## 2024-01-22 ENCOUNTER — Ambulatory Visit
Admission: RE | Admit: 2024-01-22 | Discharge: 2024-01-22 | Disposition: A | Discharge status: Home / Self Care | Payer: BC Managed Care – PPO | Source: Ambulatory Visit | Attending: Gastroenterology | Admitting: Gastroenterology

## 2024-01-22 ENCOUNTER — Encounter: Payer: Self-pay | Admitting: Gastroenterology

## 2024-01-22 DIAGNOSIS — Z1211 Encounter for screening for malignant neoplasm of colon: Secondary | ICD-10-CM | POA: Diagnosis present

## 2024-01-22 HISTORY — PX: COLONOSCOPY WITH PROPOFOL: SHX5780

## 2024-01-22 LAB — POCT PREGNANCY, URINE: Preg Test, Ur: NEGATIVE

## 2024-01-22 SURGERY — COLONOSCOPY WITH PROPOFOL
Anesthesia: General

## 2024-01-22 MED ORDER — PROPOFOL 10 MG/ML IV BOLUS
INTRAVENOUS | Status: AC
  Filled 2024-01-22: qty 20

## 2024-01-22 MED ORDER — LIDOCAINE HCL (PF) 2 % IJ SOLN
INTRAMUSCULAR | Status: AC
  Filled 2024-01-22: qty 5

## 2024-01-22 MED ORDER — LIDOCAINE HCL (CARDIAC) PF 100 MG/5ML IV SOSY
PREFILLED_SYRINGE | INTRAVENOUS | Status: DC | PRN
  Administered 2024-01-22: 60 mg via INTRAVENOUS

## 2024-01-22 MED ORDER — PROPOFOL 10 MG/ML IV BOLUS
INTRAVENOUS | Status: DC | PRN
Start: 2024-01-22 — End: 2024-01-22
  Administered 2024-01-22: 130 mg via INTRAVENOUS
  Administered 2024-01-22: 20 mg via INTRAVENOUS
  Administered 2024-01-22: 50 mg via INTRAVENOUS

## 2024-01-22 MED ORDER — SODIUM CHLORIDE 0.9 % IV SOLN: INTRAVENOUS | Status: DC

## 2024-01-22 NOTE — Op Note (Signed)
 [facility] Gastroenterology Patient Name: Alexandria Snyder Procedure Date: 01/22/2024 11:18 AM MRN: [id number] Account #: [id number] Date of Birth: [birth year] Admit Type: Outpatient Age: 48 Room: [facility] ENDO ROOM 1 Gender: Female Note Status: Finalized Instrument Name: Peds Colonoscope [id number] Procedure:             Colonoscopy Indications:           Screening for colorectal malignant neoplasm Providers:             Jaynie Collins DO, DO Referring MD:          No Local Md, MD (Referring MD) Medicines:             Monitored Anesthesia Care Complications:         No immediate complications. Estimated blood loss: None. Procedure:             Pre-Anesthesia Assessment:                        - Prior to the procedure, a History and Physical was                         performed, and patient medications and allergies were                         reviewed. The patient is competent. The risks and                         benefits of the procedure and the sedation options and                         risks were discussed with the patient. All questions                         were answered and informed consent was obtained.                         Patient identification and proposed procedure were                         verified by the physician, the nurse, the anesthetist                         and the technician in the endoscopy suite. Mental                         Status Examination: alert and oriented. Airway                         Examination: normal oropharyngeal airway and neck                         mobility. Respiratory Examination: clear to                         auscultation. CV Examination: RRR, no murmurs, no S3                         or S4. Prophylactic Antibiotics: The patient does not  require prophylactic antibiotics. Prior                         Anticoagulants: The patient has taken no anticoagulant                          or antiplatelet agents. ASA Grade Assessment: II - A                         patient with mild systemic disease. After reviewing                         the risks and benefits, the patient was deemed in                         satisfactory condition to undergo the procedure. The                         anesthesia plan was to use monitored anesthesia care                         (MAC). Immediately prior to administration of                         medications, the patient was re-assessed for adequacy                         to receive sedatives. The heart rate, respiratory                         rate, oxygen saturations, blood pressure, adequacy of                         pulmonary ventilation, and response to care were                         monitored throughout the procedure. The physical                         status of the patient was re-assessed after the                         procedure.                        After obtaining informed consent, the colonoscope was                         passed under direct vision. Throughout the procedure,                         the patient's blood pressure, pulse, and oxygen                         saturations were monitored continuously. The                         Colonoscope was introduced through the anus and  advanced to the the terminal ileum, with                         identification of the appendiceal orifice and IC                         valve. The terminal ileum, ileocecal valve,                         appendiceal orifice, and rectum were photographed. The                         colonoscopy was performed without difficulty. The                         patient tolerated the procedure well. The quality of                         the bowel preparation was evaluated using the BBPS                         [facility] Bowel Preparation Scale) with scores of: Right                         Colon = 3, Transverse  Colon = 3 and Left Colon = 3                         (entire mucosa seen well with no residual staining,                         small fragments of stool or opaque liquid). The total                         BBPS score equals 9. The terminal ileum, ileocecal                         valve, appendiceal orifice, and rectum were                         photographed. Findings:      The perianal and digital rectal examinations were normal. Pertinent       negatives include normal sphincter tone.      The terminal ileum appeared normal. Estimated blood loss: none.      Retroflexion in the right colon was performed.      The entire examined colon appeared normal on direct and retroflexion       views. Impression:            - The examined portion of the ileum was normal.                        - The entire examined colon is normal on direct and                         retroflexion views.                        - No specimens collected.                        -                        -  La porcin examinada del leon era normal.                        - Todo el colon examinado es normal en las vistas                         directa y en retroflexin.                        - No se recolectaron especmenes. Recommendation:        - Patient has a contact number available for                         emergencies. The signs and symptoms of potential                         delayed complications were discussed with the patient.                         Return to normal activities tomorrow. Written                         discharge instructions were provided to the patient.                        - Discharge patient to home.                        - Resume previous diet.                        - Continue present medications.                        - Repeat colonoscopy in 10 years for screening                         purposes.                        - Return to referring physician as previously                          scheduled.                        - The findings and recommendations were discussed with                         the patient. Procedure Code(s):     --- Professional ---                        [id number], Colonoscopy, flexible; diagnostic, including                         collection of specimen(s) by brushing or washing, when                         performed (separate procedure) Diagnosis Code(s):     --- Professional ---  Z12.11, Encounter for screening for malignant neoplasm                         of colon CPT copyright 2022 American Medical Association. All rights reserved. The codes documented in this report are preliminary and upon coder review may  be revised to meet current compliance requirements. Attending Participation:      I personally performed the entire procedure. Elfredia Nevins, DO Jaynie Collins DO, DO 01/22/2024 11:41:39 AM This report has been signed electronically. Number of Addenda: 0 Note Initiated On: 01/22/2024 11:18 AM Scope Withdrawal Time: 0 hours 7 minutes 48 seconds  Total Procedure Duration: 0 hours 10 minutes 18 seconds  Estimated Blood Loss:  Estimated blood loss: none.      [facility]

## 2024-01-22 NOTE — Anesthesia Postprocedure Evaluation (Signed)
 Anesthesia Post Note  Patient: Alexandria Snyder  Procedure(s) Performed: COLONOSCOPY WITH PROPOFOL  Patient location during evaluation: PACU Anesthesia Type: General Level of consciousness: awake and awake and alert Pain management: pain level controlled Vital Signs Assessment: post-procedure vital signs reviewed and stable Respiratory status: spontaneous breathing Cardiovascular status: stable Anesthetic complications: no   No notable events documented.   Last Vitals:  Vitals:   01/22/24 1142 01/22/24 1156  BP: 114/61 116/73  Pulse: 63 (!) 57  Resp: 16 (!) 21  Temp:    SpO2: 99% 98%    Last Pain:  Vitals:   01/22/24 1140  TempSrc: Temporal  PainSc:                  VAN STAVEREN,Khayla Koppenhaver

## 2024-01-22 NOTE — Interval H&P Note (Signed)
 History and Physical Interval Note: Preprocedure H&P from 01/22/24  was reviewed and there was no interval change after seeing and examining the patient.  Written consent was obtained from the patient after discussion of risks, benefits, and alternatives. Patient has consented to proceed with Colonoscopy with possible intervention   01/22/2024 11:16 AM  Alexandria Snyder  has presented today for surgery, with the diagnosis of Z12.11 (ICD-10-CM) - Colon cancer screening.  The various methods of treatment have been discussed with the patient and family. After consideration of risks, benefits and other options for treatment, the patient has consented to  Procedure(s) with comments: COLONOSCOPY WITH PROPOFOL (N/A) - SPANISH INTERPRETER as a surgical intervention.  The patient's history has been reviewed, patient examined, no change in status, stable for surgery.  I have reviewed the patient's chart and labs.  Questions were answered to the patient's satisfaction.     Jaynie Collins

## 2024-01-22 NOTE — H&P (Signed)
 Pre-Procedure H&P   Patient ID: Alexandria Snyder is a 48 y.o. female.  Gastroenterology Provider: Jaynie Collins, DO  Referring Provider: Tawni Pummel, PA PCP: [facility]  Date: 01/22/2024  HPI Ms. Alexandria Snyder is a 48 y.o. female who presents today for Colonoscopy for Colorectal cancer screening .  Patient is a native Spanish speaking so discussion is assisted by video medical Spanish interpreter Alexandria Snyder [phone]).  Undergoing her initial colorectal cancer screening.  No family history of colon cancer or colon polyps.  She reports regular bowel moods without melena or hematochezia.   Past Medical History:  Diagnosis Date   Medical history non-contributory     Past Surgical History:  Procedure Laterality Date   CESAREAN SECTION N/A 10/11/2016   Procedure: CESAREAN SECTION;  Surgeon: Suzy Bouchard, MD;  Location: [facility] ORS;  Service: Obstetrics;  Laterality: N/A;   NO PAST SURGERIES      Family History No h/o GI disease or malignancy  Review of Systems  Constitutional:  Negative for activity change, appetite change, chills, diaphoresis, fatigue, fever and unexpected weight change.  HENT:  Negative for trouble swallowing and voice change.   Respiratory:  Negative for shortness of breath and wheezing.   Cardiovascular:  Negative for chest pain, palpitations and leg swelling.  Gastrointestinal:  Negative for abdominal distention, abdominal pain, anal bleeding, blood in stool, constipation, diarrhea, nausea, rectal pain and vomiting.  Musculoskeletal:  Negative for arthralgias and myalgias.  Skin:  Negative for color change and pallor.  Neurological:  Negative for dizziness, syncope and weakness.  Psychiatric/Behavioral:  Negative for confusion.   All other systems reviewed and are negative.    Medications No current facility-administered medications on file prior to encounter.   Current Outpatient  Medications on File Prior to Encounter  Medication Sig Dispense Refill   metFORMIN (GLUCOPHAGE) 1000 MG tablet Take 1,000 mg by mouth 2 (two) times daily with a meal.     acetaminophen (TYLENOL) 325 MG tablet Take 2 tablets (650 mg total) by mouth every 4 (four) hours. 120 tablet 1   ibuprofen (ADVIL,MOTRIN) 600 MG tablet Take 1 tablet (600 mg total) by mouth every 6 (six) hours. 120 tablet 0   nitrofurantoin, macrocrystal-monohydrate, (MACROBID) 100 MG capsule Take 1 capsule (100 mg total) by mouth 2 (two) times daily. 10 capsule 0   oxyCODONE (OXY IR/ROXICODONE) 5 MG immediate release tablet Take 1 tablet (5 mg total) by mouth every 4 (four) hours as needed for severe pain (pain scale 4-7). 21 tablet 0   phenazopyridine (PYRIDIUM) 200 MG tablet Take 1 tablet (200 mg total) by mouth 3 (three) times daily. 6 tablet 0   Prenatal Vit-Fe Fumarate-FA (MULTIVITAMIN-PRENATAL) 27-0.8 MG TABS tablet Take 1 tablet by mouth daily at 12 noon.      Pertinent medications related to GI and procedure were reviewed by me with the patient prior to the procedure   Current Facility-Administered Medications:    0.9 %  sodium chloride infusion, , Intravenous, Continuous, Alexandria Collins, DO, Last Rate: 20 mL/hr at 01/22/24 1109, Continued from Pre-op at 01/22/24 1109  sodium chloride 20 mL/hr at 01/22/24 1109       No Known Allergies Allergies were reviewed by me prior to the procedure  Objective   Body mass index is 30.54 kg/m. Vitals:   01/22/24 1100  BP: (!) 128/94  Pulse: 66  Resp: 16  Temp: (!) 96.9 F (36.1 C)  TempSrc: Temporal  SpO2: 98%  Weight: 75.8 kg  Height: 5\' 2"  (1.575 m)     Physical Exam Vitals and nursing note reviewed.  Constitutional:      General: She is not in acute distress.    Appearance: Normal appearance. She is not ill-appearing, toxic-appearing or diaphoretic.  HENT:     Head: Normocephalic and atraumatic.     Nose: Nose normal.     Mouth/Throat:      Mouth: Mucous membranes are moist.     Pharynx: Oropharynx is clear.  Eyes:     General: No scleral icterus.    Extraocular Movements: Extraocular movements intact.  Cardiovascular:     Rate and Rhythm: Normal rate and regular rhythm.     Heart sounds: Normal heart sounds. No murmur heard.    No friction rub. No gallop.  Pulmonary:     Effort: Pulmonary effort is normal. No respiratory distress.     Breath sounds: Normal breath sounds. No wheezing, rhonchi or rales.  Abdominal:     General: Bowel sounds are normal. There is no distension.     Palpations: Abdomen is soft.     Tenderness: There is no abdominal tenderness. There is no guarding or rebound.  Musculoskeletal:     Cervical back: Neck supple.     Right lower leg: No edema.     Left lower leg: No edema.  Skin:    General: Skin is warm and dry.     Coloration: Skin is not jaundiced or pale.  Neurological:     General: No focal deficit present.     Mental Status: She is alert and oriented to person, place, and time. Mental status is at baseline.  Psychiatric:        Mood and Affect: Mood normal.        Behavior: Behavior normal.        Thought Content: Thought content normal.        Judgment: Judgment normal.      Assessment:  Ms. Alexandria Snyder is a 48 y.o. female  who presents today for Colonoscopy for Colorectal cancer screening .  Plan:  Colonoscopy with possible intervention today  Colonoscopy with possible biopsy, control of bleeding, polypectomy, and interventions as necessary has been discussed with the patient/patient representative. Informed consent was obtained from the patient/patient representative after explaining the indication, nature, and risks of the procedure including but not limited to death, bleeding, perforation, missed neoplasm/lesions, cardiorespiratory compromise, and reaction to medications. Opportunity for questions was given and appropriate answers were provided. Patient/patient  representative has verbalized understanding is amenable to undergoing the procedure.   Alexandria Collins, DO  [facility] Gastroenterology  Portions of the record may have been created with voice recognition software. Occasional wrong-word or 'sound-a-like' substitutions may have occurred due to the inherent limitations of voice recognition software.  Read the chart carefully and recognize, using context, where substitutions may have occurred.

## 2024-01-22 NOTE — Anesthesia Preprocedure Evaluation (Signed)
 Anesthesia Evaluation  Patient identified by MRN, date of birth, ID band Patient awake    Reviewed: Allergy & Precautions, NPO status , Patient's Chart, lab work & pertinent test results  Airway Mallampati: II  TM Distance: >3 FB Neck ROM: full    Dental  (+) Teeth Intact   Pulmonary neg pulmonary ROS   Pulmonary exam normal        Cardiovascular Exercise Tolerance: Good negative cardio ROS Normal cardiovascular exam Rhythm:Regular Rate:Normal     Neuro/Psych negative neurological ROS  negative psych ROS   GI/Hepatic negative GI ROS, Neg liver ROS,,,  Endo/Other  negative endocrine ROS    Renal/GU negative Renal ROS  negative genitourinary   Musculoskeletal   Abdominal Normal abdominal exam  (+)   Peds negative pediatric ROS (+)  Hematology negative hematology ROS (+)   Anesthesia Other Findings Past Medical History: No date: Medical history non-contributory  Past Surgical History: 10/11/2016: CESAREAN SECTION; N/A     Comment:  Procedure: CESAREAN SECTION;  Surgeon: Suzy Bouchard, MD;  Location: [facility] ORS;  Service:               Obstetrics;  Laterality: N/A; No date: NO PAST SURGERIES     Reproductive/Obstetrics negative OB ROS                             Anesthesia Physical Anesthesia Plan  ASA: 2  Anesthesia Plan: General   Post-op Pain Management:    Induction: Intravenous  PONV Risk Score and Plan: Propofol infusion and TIVA  Airway Management Planned: Natural Airway and Nasal Cannula  Additional Equipment:   Intra-op Plan:   Post-operative Plan:   Informed Consent: I have reviewed the patients History and Physical, chart, labs and discussed the procedure including the risks, benefits and alternatives for the proposed anesthesia with the patient or authorized representative who has indicated his/her understanding and acceptance.      Dental Advisory Given  Plan Discussed with: CRNA  Anesthesia Plan Comments:        Anesthesia Quick Evaluation

## 2024-01-22 NOTE — Transfer of Care (Signed)
 Immediate Anesthesia Transfer of Care Note  Patient: Alexandria Snyder  Procedure(s) Performed: COLONOSCOPY WITH PROPOFOL  Patient Location: PACU and Endoscopy Unit  Anesthesia Type:General  Level of Consciousness: awake, alert , oriented, and patient cooperative  Airway & Oxygen Therapy: Patient Spontanous Breathing  Post-op Assessment: Report given to RN, Post -op Vital signs reviewed and stable, and Patient moving all extremities X 4  Post vital signs: Reviewed and stable  Last Vitals:  Vitals Value Taken Time  BP 114/61 01/22/24 1142  Temp 97   Pulse 67 01/22/24 1142  Resp 25 01/22/24 1142  SpO2 99 % 01/22/24 1142  Vitals shown include unfiled device data.  Last Pain:  Vitals:   01/22/24 1100  TempSrc: Temporal  PainSc: 0-No pain         Complications: No notable events documented.

## 2024-01-23 ENCOUNTER — Encounter: Payer: Self-pay | Admitting: Gastroenterology

## 2024-05-05 ENCOUNTER — Other Ambulatory Visit: Payer: Self-pay | Admitting: Family Medicine

## 2024-05-05 DIAGNOSIS — Z1231 Encounter for screening mammogram for malignant neoplasm of breast: Secondary | ICD-10-CM
